# Patient Record
Sex: Male | Born: 1985 | Race: White | Hispanic: No | Marital: Married | Smoking: Never smoker
Health system: Southern US, Community
[De-identification: ages and names within clinical notes are randomized; demographics above are authoritative.]

## PROBLEM LIST (undated history)

## (undated) DIAGNOSIS — I10 Essential (primary) hypertension: Secondary | ICD-10-CM

## (undated) HISTORY — PX: HERNIA REPAIR: SHX51

## (undated) HISTORY — DX: Essential (primary) hypertension: I10

---

## 2011-11-20 ENCOUNTER — Ambulatory Visit (HOSPITAL_COMMUNITY): Admission: RE | Admit: 2011-11-20 | Payer: Self-pay | Source: Ambulatory Visit

## 2011-11-20 ENCOUNTER — Ambulatory Visit (HOSPITAL_COMMUNITY): Payer: Self-pay | Attending: Neurology

## 2011-11-27 ENCOUNTER — Ambulatory Visit (HOSPITAL_COMMUNITY): Admission: RE | Admit: 2011-11-27 | Payer: Self-pay | Source: Ambulatory Visit

## 2011-12-11 ENCOUNTER — Ambulatory Visit (HOSPITAL_COMMUNITY)
Admission: RE | Admit: 2011-12-11 | Discharge: 2011-12-11 | Disposition: A | Discharge status: Home / Self Care | Payer: Worker's Compensation | Source: Ambulatory Visit | Attending: Neurology | Admitting: Neurology

## 2011-12-11 DIAGNOSIS — R9431 Abnormal electrocardiogram [ECG] [EKG]: Secondary | ICD-10-CM | POA: Insufficient documentation

## 2014-01-26 ENCOUNTER — Ambulatory Visit: Payer: Self-pay | Admitting: Family

## 2014-03-02 ENCOUNTER — Ambulatory Visit: Payer: Self-pay | Admitting: Family

## 2014-06-08 ENCOUNTER — Encounter: Payer: Self-pay | Admitting: Family Medicine

## 2014-06-08 ENCOUNTER — Ambulatory Visit (INDEPENDENT_AMBULATORY_CARE_PROVIDER_SITE_OTHER): Payer: Managed Care, Other (non HMO) | Admitting: Family Medicine

## 2014-06-08 VITALS — BP 149/100 | HR 93 | Ht 71.0 in | Wt 309.0 lb

## 2014-06-08 DIAGNOSIS — G4733 Obstructive sleep apnea (adult) (pediatric): Secondary | ICD-10-CM | POA: Insufficient documentation

## 2014-06-08 DIAGNOSIS — E291 Testicular hypofunction: Secondary | ICD-10-CM | POA: Insufficient documentation

## 2014-06-08 DIAGNOSIS — K429 Umbilical hernia without obstruction or gangrene: Secondary | ICD-10-CM

## 2014-06-08 DIAGNOSIS — Z9989 Dependence on other enabling machines and devices: Secondary | ICD-10-CM

## 2014-06-08 DIAGNOSIS — R635 Abnormal weight gain: Secondary | ICD-10-CM

## 2014-06-08 NOTE — Progress Notes (Signed)
CC: Spencer HousekeeperMichael Delgado is a 29 y.o. male is here for Establish Care   Subjective: HPI:  Pleasant 29 year old here to establish care, husband of Spencer Delgado  Complains of difficulty with losing weight. He has tried multiple different diet plans most recently a 2000-calorie low carbohydrate predominantly protein diet that he stuck with for at least 8 weeks and did not lose any weight whatsoever. He stays active with his children, no formal exercise routine. Tries to watch what he eats. Has been gaining weight ever since a head injury many years ago all of this is been unintentional. He's been found to have a low testosterone level, early last year was in the 200s, he's been taking 200 mg twice a month and although he's been able to get it in the 500 range has not noticed any improvement with any degree of energy, libido or ability to lose weight. He's never had his thyroid checked  Complains of umbilical pain that has been present for at least the last 2 weeks. He believes that he has a hernia. It is worse to the touch or if he has any food that causes him to have flatulence. He denies melena or blood in his stool nor nausea. Pain is described only as pain and nonradiating. He had a history of an inguinal hernia that was repaired as a child with no manipulation of his umbilicus.  He has a history obstructive sleep apnea. He uses his CPAP nightly and it has made a great difference in prior issues with nonrestorative sleep.  Review of Systems - General ROS: negative for - chills, fever, night sweats, weight gain Ophthalmic ROS: negative for - decreased vision Psychological ROS: negative for - anxiety or depression ENT ROS: negative for - hearing change, nasal congestion, tinnitus or allergies Hematological and Lymphatic ROS: negative for - bleeding problems, bruising or swollen lymph nodes Breast ROS: negative Respiratory ROS: no cough, shortness of breath, or wheezing Cardiovascular ROS: no chest pain or  dyspnea on exertion Gastrointestinal ROS: no  change in bowel habits, or black or bloody stools Genito-Urinary ROS: negative for - genital discharge, genital ulcers, incontinence or abnormal bleeding from genitals Musculoskeletal ROS: negative for - joint pain or muscle pain Neurological ROS: negative for - headaches or memory loss Dermatological ROS: negative for lumps, mole changes, rash and skin lesion changes  Past Medical History  Diagnosis Date  . Hypertension     Past Surgical History  Procedure Laterality Date  . Hernia repair     Family History  Problem Relation Age of Onset  . Heart attack      grandfather   . Hyperlipidemia Mother     History   Social History  . Marital Status: Married    Spouse Name: N/A    Number of Children: N/A  . Years of Education: N/A   Occupational History  . Not on file.   Social History Main Topics  . Smoking status: Never Smoker   . Smokeless tobacco: Not on file  . Alcohol Use: 0.0 oz/week    0 Not specified per week  . Drug Use: No  . Sexual Activity:    Partners: Female   Other Topics Concern  . Not on file   Social History Narrative  . No narrative on file     Objective: BP 149/100 mmHg  Pulse 93  Ht 5\' 11"  (1.803 m)  Wt 309 lb (140.161 kg)  BMI 43.12 kg/m2  General: Alert and Oriented, No Acute  Distress HEENT: Pupils equal, round, reactive to light. Conjunctivae clear.  Moist mucous membranes Lungs: Clear to auscultation bilaterally, no wheezing/ronchi/rales.  Comfortable work of breathing. Good air movement. Cardiac: Regular rate and rhythm. Normal S1/S2.  No murmurs, rubs, nor gallops.   Abdomen: obese, soft no rebound or guarding. There is a approximately 1 and 1/2 cm diameter abdominal wall defect at the umbilicus with a hernia the size of a grape Extremities: No peripheral edema.  Strong peripheral pulses.  Mental Status: No depression, anxiety, nor agitation. Skin: Warm and dry.  Assessment &  Plan: Spencer Delgado was seen today for establish care.  Diagnoses and associated orders for this visit:  Hypogonadism in male  Umbilical hernia without obstruction and without gangrene - Ambulatory referral to General Surgery  OSA on CPAP  Abnormal weight gain - COMPLETE METABOLIC PANEL WITH GFR - TSH - T4    Abnormal weight gain: Checking metabolic panel TSH and free T4 to rule out primarily or secondary hypothyroidism Objective sleep apnea: Clinically controlled Umbilical hernia: We've referred him to general surgery to have this repaired, discussed avoiding strenuous activity to prevent progression of pain. Signs and symptoms requring emergent/urgent reevaluation were discussed with the patient. I've offered to him manage his testosterone and hypogonadism if he would prefer in the future.  Return if symptoms worsen or fail to improve.

## 2014-06-15 LAB — BASIC METABOLIC PANEL
Creatinine: 0.9 mg/dL (ref 0.6–1.3)
Glucose: 89 mg/dL
POTASSIUM: 4.4 mmol/L (ref 3.4–5.3)

## 2014-06-15 LAB — TSH: TSH: 1.73 u[IU]/mL (ref 0.41–5.90)

## 2014-06-15 LAB — CMP14+LP+1AC+CBC/D/PLT+TSH: CALCIUM: 10 mg/dL

## 2014-06-15 LAB — HEPATIC FUNCTION PANEL
ALT: 40 U/L (ref 10–40)
AST: 30 U/L (ref 14–40)
Alkaline Phosphatase: 69 U/L (ref 25–125)

## 2014-06-15 LAB — TSH+FREE T4: THYROXINE (T4): 7

## 2014-06-16 ENCOUNTER — Telehealth: Payer: Self-pay | Admitting: Family Medicine

## 2014-06-16 NOTE — Telephone Encounter (Signed)
Message left on vm 

## 2014-06-16 NOTE — Telephone Encounter (Signed)
Sue Lushndrea, Will you please let patient know that his thyroid function, liver function, blood sugar, and kidney function were all normal.  If he's interested in weight loss medication there are a variety of options that we can talk about when he follows up at his convenience.

## 2014-06-22 ENCOUNTER — Ambulatory Visit (INDEPENDENT_AMBULATORY_CARE_PROVIDER_SITE_OTHER): Payer: Managed Care, Other (non HMO) | Admitting: Family Medicine

## 2014-06-22 ENCOUNTER — Encounter: Payer: Self-pay | Admitting: Family Medicine

## 2014-06-22 VITALS — BP 153/95 | HR 98 | Wt 313.0 lb

## 2014-06-22 DIAGNOSIS — E291 Testicular hypofunction: Secondary | ICD-10-CM

## 2014-06-22 DIAGNOSIS — R635 Abnormal weight gain: Secondary | ICD-10-CM

## 2014-06-22 MED ORDER — TOPIRAMATE 50 MG PO TABS: 50.0000 mg | ORAL_TABLET | Freq: Two times a day (BID) | ORAL | Status: DC

## 2014-06-22 MED ORDER — AMBULATORY NON FORMULARY MEDICATION: Status: DC

## 2014-06-22 MED ORDER — TESTOSTERONE CYPIONATE 200 MG/ML IM SOLN: 300.0000 mg | INTRAMUSCULAR | Status: DC

## 2014-06-22 MED ORDER — TESTOSTERONE CYPIONATE 200 MG/ML IM SOLN: 200.0000 mg | INTRAMUSCULAR | Status: DC

## 2014-06-22 NOTE — Progress Notes (Signed)
CC: Dione HousekeeperMichael Delgado is a 29 y.o. male is here for wt check   Subjective: HPI:  Continued abnormal weight gain. He goes over his diet over the past few days it does not, he is eating more than 2000 cal a day, he tells me he's had similar meals since I saw him last and in about a week he's gained 4 pounds unintentionally. 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 Thyroid function was normal as saw him last. He has not noticed any benefit with weight loss or reduced weight gain since starting on testosterone little over year ago.  He tells me that since starting testosterone he has not noticed any change with libido, energy level or with weight gain. He's been taking 200 mg every 2 weeks. He wants know if I can take over his management of testosterone   Review Of Systems Outlined In HPI  Past Medical History  Diagnosis Date  . Hypertension     Past Surgical History  Procedure Laterality Date  . Hernia repair     Family History  Problem Relation Age of Onset  . Heart attack      grandfather   . Hyperlipidemia Mother     History   Social History  . Marital Status: Married    Spouse Name: N/A  . Number of Children: N/A  . Years of Education: N/A   Occupational History  . Not on file.   Social History Main Topics  . Smoking status: Never Smoker   . Smokeless tobacco: Not on file  . Alcohol Use: 0.0 oz/week    0 Standard drinks or equivalent per week  . Drug Use: No  . Sexual Activity:    Partners: Female   Other Topics Concern  . Not on file   Social History Narrative     Objective: BP 153/95 mmHg  Pulse 98  Wt 313 lb (141.976 kg)  Vital signs reviewed. General: Alert and Oriented, No Acute Distress HEENT: Pupils equal, round, reactive to light. Conjunctivae clear.  External ears unremarkable.  Moist mucous membranes. Lungs: Clear and comfortable work of breathing, speaking in full sentences without accessory  muscle use. Cardiac: Regular rate and rhythm.  Neuro: CN II-XII grossly intact, gait normal. Extremities: No peripheral edema.  Strong peripheral pulses.  Mental Status: No depression, anxiety, nor agitation. Logical though process. Skin: Warm and dry.  Assessment & Plan: Spencer Delgado was seen today for wt check.  Diagnoses and all orders for this visit:  Abnormal weight gain Orders: -     Discontinue: topiramate (TOPAMAX) 50 MG tablet; Take 1 tablet (50 mg total) by mouth 2 (two) times daily. To help reduce appetite. -     Ambulatory referral to Endocrinology -     topiramate (TOPAMAX) 50 MG tablet; Take 1 tablet (50 mg total) by mouth 2 (two) times daily. To help reduce appetite.  Hypogonadism male Orders: -     Ambulatory referral to Endocrinology -     testosterone cypionate (DEPOTESTOTERONE CYPIONATE) 200 MG/ML injection; Inject 1.5 mLs (300 mg total) into the muscle every 14 (fourteen) days.  Other orders -     Discontinue: testosterone cypionate (DEPOTESTOTERONE CYPIONATE) 200 MG/ML injection; Inject 1 mL (200 mg total) into the muscle every 14 (fourteen) days. -     AMBULATORY NON FORMULARY MEDICATION; 2mL syringes and 23G 1-1/2 inch needles.  Dx: Testosterone Deficiency   Abnormal weight gain: Start Topamax, joint agreement that this is unlikely  to have much of a powerful effect but is worth trying since it does not seem that his portion control or caloric intake is significantly contributing to his weight gain. Referral to endocrinology given history of brain injury to see if there is a pituitary dysfunction contributing to his weight gain Hypogonadism: Uncontrolled chronic condition increasing testosterone to 300 mg every 2 weeks  25 minutes spent face-to-face during visit today of which at least 50% was counseling or coordinating care regarding: 1. Abnormal weight gain   2. Hypogonadism male      Return in about 4 weeks (around 07/20/2014).

## 2014-06-29 ENCOUNTER — Telehealth: Payer: Self-pay | Admitting: *Deleted

## 2014-06-29 NOTE — Telephone Encounter (Signed)
Pt is calling to check on the status of his referral. He states he has not heard anything but when looking back in the chart someone did call him but pt told him he was seeing someone for abnormal wt gain and testosterone. The referral was placed for abnormal wt gain and testosterone since he had not been losing wt so the referral was correct.(see office notes) Can you please call and get this patient scheduled?

## 2014-06-29 NOTE — Telephone Encounter (Signed)
Mr. Spencer Delgado has been scheduled for 3/3 @ 9:00/arrival time 8:45. He is aware of his appt.

## 2014-07-14 ENCOUNTER — Encounter: Payer: Self-pay | Admitting: Endocrinology

## 2014-07-14 ENCOUNTER — Ambulatory Visit (INDEPENDENT_AMBULATORY_CARE_PROVIDER_SITE_OTHER): Payer: BLUE CROSS/BLUE SHIELD | Admitting: Endocrinology

## 2014-07-14 VITALS — BP 168/105 | HR 96 | Temp 97.7°F | Resp 16 | Ht 71.0 in | Wt 313.0 lb

## 2014-07-14 DIAGNOSIS — R7989 Other specified abnormal findings of blood chemistry: Secondary | ICD-10-CM

## 2014-07-14 DIAGNOSIS — R635 Abnormal weight gain: Secondary | ICD-10-CM

## 2014-07-14 DIAGNOSIS — E291 Testicular hypofunction: Secondary | ICD-10-CM

## 2014-07-14 DIAGNOSIS — R5383 Other fatigue: Secondary | ICD-10-CM

## 2014-07-14 LAB — T4, FREE: FREE T4: 0.8 ng/dL (ref 0.60–1.60)

## 2014-07-14 LAB — GLUCOSE, RANDOM: Glucose, Bld: 99 mg/dL (ref 70–99)

## 2014-07-14 LAB — TESTOSTERONE: Testosterone: 344.03 ng/dL (ref 300.00–890.00)

## 2014-07-14 NOTE — Progress Notes (Addendum)
Patient ID: Dione HousekeeperMichael Braddock, male   DOB: February 14, 1986, 29 y.o.   MRN: [id number]          Chief complaint:  Fatigue  History of Present Illness  He has had complaints offatigue, decreased motivation, decreased libido, lack of energy since 2012 The symptoms started sometime after his head injury in late 2011 He thinks he was previously feeling fairly good but subsequently has had continued sluggishness and also weight gain.    There is no history of the following: Hot flushes, sweats, breast enlargement, long term steroid use, history of testicular injury or mumps in childhood. No history of low impact fracture He has not had any problems with fertility, his last and third child was 19 months ago  He was seen by urologist a year ago and because of his decreased libido and he was tested for hypogonadism  Prior lab results show baselinetestosterone level of 205, not clear what time of the day this was drawn.  No free testosterone level was done Prolactin level: Not done LH level: None    Testoserone supplements that hehas used include Depo-Testosterone injections, 200 mg every 2 weeks starting in 08/2013 He was trained how to do the injections himself and has been doing them regularly.  He does not think he has any improvement in his energy level with the testosterone and he continues to gain weight. He may have increased libido for 2-3 days after his testosterone injection but very little after the first week  He has not had any follow-up levels of his testosterone He was suggested by his PCP that he increase the dose to 300 mg but he has not done this as yet.  He is due for his 2 week injection today     Medication List       This list is accurate as of: 07/14/14  9:37 AM.  Always use your most recent med list.               AMBULATORY NON FORMULARY MEDICATION  2mL syringes and 23G 1-1/2 inch needles.  Dx: Testosterone Deficiency     testosterone cypionate 200  MG/ML injection  Commonly known as:  DEPOTESTOTERONE CYPIONATE  Inject 1.5 mLs (300 mg total) into the muscle every 14 (fourteen) days.     topiramate 50 MG tablet  Commonly known as:  TOPAMAX  Take 1 tablet (50 mg total) by mouth 2 (two) times daily. To help reduce appetite.        Allergies:  Allergies  Allergen Reactions  . Morphine And Related Other (See Comments)    Stops heart    Past Medical History  Diagnosis Date  . Hypertension     Past Surgical History  Procedure Laterality Date  . Hernia repair      Family History  Problem Relation Age of Onset  . Heart attack      grandfather   . Hyperlipidemia Mother     Social History:  reports that he has never smoked. He does not have any smokeless tobacco history on file. He reports that he drinks alcohol. He reports that he does not use illicit drugs.  ROS   Constitutional: he has had continual weight gain since 2012  He started gaining weight following his brain injury in 2011 when he was 220 pounds He thinks he has gained weight consistently when last year when he has probably gained about 40 pounds He thinks he is trying to eat healthy and not nodular portions or  fast food Does try to exercise a little   Wt Readings from Last 3 Encounters:  07/14/14 313 lb (141.976 kg)  06/22/14 313 lb (141.976 kg)  06/08/14 309 lb (140.161 kg)   Eyes: no history of blurred vision, has normal peripheral vision   ENT: no hoarseness   Cardiovascular:  No leg swelling.  He has a history of high blood pressure as all his blood pressure readings recently appear to be high, has not been suggested treatment  Respiratory: shortness of breath not present.  He is being treated for sleep apnea   Gastrointestinal: no constipation or abdominal pain  Musculoskeletal: no joint aches  Neurological: no headaches, numbness or tingling in feet or hands  Psychiatric: no symptoms of anxiety or depressed mood.  Has lack of  motivation  Endocrine: No heat or cold intolerance  No history of abnormal fasting glucose or diabetes, no recent labs available however  No history of unusual headaches   General Examination:   BP 168/105 mmHg  Pulse 96  Temp(Src) 97.7 F (36.5 C)  Resp 16  Ht  (1.803 m)  Wt 313 lb (141.976 kg)  BMI 43.67 kg/m2  SpO2 95%  GENERAL APPEARANCE generalized obesity present, no cushingoid features.  SKIN:normal, no rash or pigmentation.  HEENT:Oral mucosa normal. Normal oropharyngeal opening.  EYES:normal external appearance of eyes, Fundii benign.   NECK:no lymphadenopathy, no thyromegaly.  CHEST: Gynecomastia absent on the right and minimal on the left LUNGS:clear to auscultation bilaterally, no wheezes, rhonchi, rales.   HEART:normal S1 And S2, no S3, S4, murmur or click.  ABDOMEN:no hepatosplenomegaly, no masses palpated, soft and not tender.   MALE GENITOURINARY:His testicles are somewhat retractile but appear to be relatively small about 3 cm in size   MUSCULOSKELETALNo enlargement or deformity of joints.  EXTREMITIES:no clubbing, no edema.  NEUROLOGIC EXAM: Biceps and ankle reflexes normal (2+) bilaterally.   Assessment/ Plan:  FATIGUE which has been persistent for at least 3-4 years of unclear etiology. Although he had a relatively low normal total testosterone about a year ago not clear if this was done in a fasting state and this was not confirmed with a free testosterone level which would be necessary because of his morbid obesity. Also no evaluation for the etiology of his low testosterone was done especially with pituitary function evaluation of her prolactin levels  Further his symptoms of fatigue and sluggishness have not improved with testosterone supplementation; no follow-up testosterone levels available also Although he was concerned about the weight gain being the result of  hypogonadism have explained to him that low testosterone levels do not cause obesity.  Today will have his testosterone level checked along with pituitary hormone functions except LH Have recommended reevaluation of his testosterone level after stopping his injection for at least 6 weeks and will need fasting free testosterone level done to determine if he truly has hypogonadism  OBESITY: This appears to have started after his head injury and not clear this is due to hypothalamic cause Currently not as active as he used to be because of his fatigue and lack of motivation Not clear if he has any associated depression Currently not benefiting from Topamax which has been prescribed by PCP but discussed that he probably will need weight loss medications for controlling the obesity  Follow-up after labs complete   [facility] 07/14/2014, 9:37 AM

## 2014-07-15 LAB — PROLACTIN: Prolactin: 11.1 ng/mL (ref 4.0–15.2)

## 2014-07-15 LAB — INSULIN-LIKE GROWTH FACTOR: INSULIN LIKE GF 1: 152 ng/mL (ref 98–282)

## 2014-07-17 NOTE — Progress Notes (Signed)
Quick Note:  Please let patient know that tests for pituitary are normal and testosterone level is normal range also Will discuss further on follow-up ______

## 2014-07-19 ENCOUNTER — Ambulatory Visit (INDEPENDENT_AMBULATORY_CARE_PROVIDER_SITE_OTHER): Payer: BLUE CROSS/BLUE SHIELD | Admitting: Family Medicine

## 2014-07-19 ENCOUNTER — Encounter: Payer: Self-pay | Admitting: Family Medicine

## 2014-07-19 VITALS — BP 147/97 | HR 79 | Wt 314.0 lb

## 2014-07-19 DIAGNOSIS — E669 Obesity, unspecified: Secondary | ICD-10-CM

## 2014-07-19 DIAGNOSIS — I10 Essential (primary) hypertension: Secondary | ICD-10-CM

## 2014-07-19 DIAGNOSIS — G4733 Obstructive sleep apnea (adult) (pediatric): Secondary | ICD-10-CM

## 2014-07-19 DIAGNOSIS — Z9989 Dependence on other enabling machines and devices: Secondary | ICD-10-CM

## 2014-07-19 MED ORDER — PHENTERMINE HCL 30 MG PO TBDP: 30.0000 mg | ORAL_TABLET | Freq: Every day | ORAL | Status: DC

## 2014-07-19 MED ORDER — LISINOPRIL 20 MG PO TABS: ORAL_TABLET | ORAL | Status: DC

## 2014-07-19 NOTE — Progress Notes (Signed)
CC: Spencer HousekeeperMichael Delgado is a 29 y.o. male is here for f/u wt loss   Subjective: HPI:  Follow-up weight gain: Labs with his endocrinologist were normal. Low suspicion of pituitary abnormality contributing to his weight gain. He is still adamant that he eats less than 2000 cal a day. Stays active with his children but no formal exercise routine. No weight gain since I saw him last. He wants to know if he could try phentermine since Topamax was helpful for 2 days then ineffective. He's never been on phentermine before.  Follow essential hypertension: Reports that he's never had issues with blood pressure until he was over 200 pounds. He's never taken blood pressure medications in the past. No chest pain shortness of breath orthopnea nor peripheral edema.  Review Of Systems Outlined In HPI  Past Medical History  Diagnosis Date  . Hypertension     Past Surgical History  Procedure Laterality Date  . Hernia repair     Family History  Problem Relation Age of Onset  . Heart attack      grandfather   . Hyperlipidemia Mother     History   Social History  . Marital Status: Married    Spouse Name: N/A  . Number of Children: N/A  . Years of Education: N/A   Occupational History  . Not on file.   Social History Main Topics  . Smoking status: Never Smoker   . Smokeless tobacco: Not on file  . Alcohol Use: 0.0 oz/week    0 Standard drinks or equivalent per week  . Drug Use: No  . Sexual Activity:    Partners: Female   Other Topics Concern  . Not on file   Social History Narrative     Objective: BP 147/97 mmHg  Pulse 79  Wt 314 lb (142.429 kg)  Vital signs reviewed. General: Alert and Oriented, No Acute Distress HEENT: Pupils equal, round, reactive to light. Conjunctivae clear.  External ears unremarkable.  Moist mucous membranes. Lungs: Clear and comfortable work of breathing, speaking in full sentences without accessory muscle use. Cardiac: Regular rate and rhythm.   Abdomen: Obese soft Neuro: CN II-XII grossly intact, gait normal. Extremities: No peripheral edema.  Strong peripheral pulses.  Mental Status: No depression, anxiety, nor agitation. Logical though process. Skin: Warm and dry. Assessment & Plan: Casimiro NeedleMichael was seen today for f/u wt loss.  Diagnoses and all orders for this visit:  Essential hypertension Orders: -     lisinopril (PRINIVIL,ZESTRIL) 20 MG tablet; One tablet by mouth daily for blood pressure control.  Obesity Orders: -     Phentermine HCl 30 MG TBDP; Take 30 mg by mouth daily.  OSA on CPAP   Essential hypertension: Uncontrolled chronic condition, needs to start lisinopril immediately. Hopefully this will improve with weight loss Obesity: Uncontrolled, discussed we can try phentermine however this has the potential to worsen his blood pressure he needs to assure me that he'll be taking lisinopril and will follow-up in a month before he runs out of the medication to see if it is influencing his blood pressure at all. If this does not improve I prepared him that I would be sending him to bariatric's as the next step. Follow-up sleep apnea: When asked my office to get in touch with his respiratory supply company to see if we can get data from his sleep apnea machine regarding compliance, he also tells me that he needs new tubing and mask, he thinks is starting to crack.  25  minutes spent face-to-face during visit today of which at least 50% was counseling or coordinating care regarding: 1. Essential hypertension   2. Obesity   3. OSA on CPAP      Return in about 4 weeks (around 08/16/2014) for Weight.

## 2014-07-20 ENCOUNTER — Telehealth: Payer: Self-pay | Admitting: Family Medicine

## 2014-07-20 NOTE — Telephone Encounter (Signed)
Sue LushAndrea, Can you please contact [facility] and see if they have Spencer Delgado as a patient on their records.  He's requesting new tubing and a facemask since he believes it's starting to crack.

## 2014-07-21 NOTE — Telephone Encounter (Signed)
Called [facility] and they do have patient on file. They will ship tubing and face mask. He also qualifies for new filters and water chamber so they are shipping this to him as well and this is at no out of pocket cost to him. Left message on vm

## 2014-07-26 ENCOUNTER — Encounter: Payer: Self-pay | Admitting: Family Medicine

## 2014-08-09 ENCOUNTER — Encounter: Payer: Self-pay | Admitting: Family Medicine

## 2014-08-16 ENCOUNTER — Ambulatory Visit: Payer: Self-pay | Admitting: Family Medicine

## 2014-08-17 ENCOUNTER — Ambulatory Visit: Payer: Self-pay | Admitting: Family Medicine

## 2014-08-22 ENCOUNTER — Ambulatory Visit (INDEPENDENT_AMBULATORY_CARE_PROVIDER_SITE_OTHER): Payer: BLUE CROSS/BLUE SHIELD | Admitting: Family Medicine

## 2014-08-22 ENCOUNTER — Encounter: Payer: Self-pay | Admitting: Family Medicine

## 2014-08-22 VITALS — BP 147/95 | HR 79 | Wt 306.0 lb

## 2014-08-22 DIAGNOSIS — I10 Essential (primary) hypertension: Secondary | ICD-10-CM

## 2014-08-22 DIAGNOSIS — E669 Obesity, unspecified: Secondary | ICD-10-CM | POA: Diagnosis not present

## 2014-08-22 MED ORDER — LISINOPRIL-HYDROCHLOROTHIAZIDE 20-25 MG PO TABS: 1.0000 | ORAL_TABLET | Freq: Every day | ORAL | Status: DC

## 2014-08-22 MED ORDER — PHENTERMINE HCL 37.5 MG PO CAPS: 37.5000 mg | ORAL_CAPSULE | ORAL | Status: DC

## 2014-08-22 NOTE — Progress Notes (Signed)
CC: Spencer Delgado is a 29 y.o. male is here for f/u wt and BP   Subjective: HPI:  Follow-up obesity: Since I saw him last he has been taking phentermine on a daily basis. He tells me it has helped with portion control but he still believes there is room for improvement. He denies any known side effects. Denies anxiety, irritability, sleep disturbance or any mental disturbance.  Follow-up hypertension: Continues to take lisinopril 20 mg daily. He tells me that he forgot to mention in the past that he believes that he retains fluid. For example at the end of the day he will have it pitting wherever his socks were covering on his legs and if he is ever on his knees washing his children for more than a few minutes when he gets up there is a pit where his knees were in contact with the floor. This has been present for matter of months and did not change after starting lisinopril. He believes that the fluid retention is primarily in the lower extremities and symmetric. Its worse at the end of the day and improved after sleeping. No outside blood pressures report. No chest pain shortness of breath orthopnea   Review Of Systems Outlined In HPI  Past Medical History  Diagnosis Date  . Hypertension     Past Surgical History  Procedure Laterality Date  . Hernia repair     Family History  Problem Relation Age of Onset  . Heart attack      grandfather   . Hyperlipidemia Mother     History   Social History  . Marital Status: Married    Spouse Name: N/A  . Number of Children: N/A  . Years of Education: N/A   Occupational History  . Not on file.   Social History Main Topics  . Smoking status: Never Smoker   . Smokeless tobacco: Not on file  . Alcohol Use: 0.0 oz/week    0 Standard drinks or equivalent per week  . Drug Use: No  . Sexual Activity:    Partners: Female   Other Topics Concern  . Not on file   Social History Narrative     Objective: BP 147/95 mmHg  Pulse 79  Wt  306 lb (138.801 kg)  General: Alert and Oriented, No Acute Distress HEENT: Pupils equal, round, reactive to light. Conjunctivae clear.  Moist mucous membranes Lungs: Clear to auscultation bilaterally, no wheezing/ronchi/rales.  Comfortable work of breathing. Good air movement. Cardiac: Regular rate and rhythm. Normal S1/S2.  No murmurs, rubs, nor gallops.   Abdomen: obese and soft Extremities: trace pitting edema around the ankles bilaterally and symmetric.  Strong peripheral pulses.  Mental Status: No depression, anxiety, nor agitation. Skin: Warm and dry.  Assessment & Plan: Spencer Delgado was seen today for f/u wt and bp.  Diagnoses and all orders for this visit:  Essential hypertension Orders: -     lisinopril-hydrochlorothiazide (PRINZIDE,ZESTORETIC) 20-25 MG per tablet; Take 1 tablet by mouth daily.  Obesity Orders: -     phentermine 37.5 MG capsule; Take 1 capsule (37.5 mg total) by mouth every morning.   Essential hypertension: Uncontrolled chronic condition continue lisinopril but adding hydrochlorothiazide Obesity: Improving, increasing phentermine return in one month  Return in about 4 weeks (around 09/19/2014).

## 2014-09-09 ENCOUNTER — Other Ambulatory Visit: Payer: Self-pay

## 2014-09-13 ENCOUNTER — Telehealth: Payer: Self-pay | Admitting: Endocrinology

## 2014-09-13 ENCOUNTER — Encounter: Payer: Self-pay | Admitting: *Deleted

## 2014-09-13 ENCOUNTER — Ambulatory Visit: Payer: Self-pay | Admitting: Endocrinology

## 2014-09-13 DIAGNOSIS — Z0289 Encounter for other administrative examinations: Secondary | ICD-10-CM

## 2014-09-13 NOTE — Telephone Encounter (Signed)
Patient no showed today's appt. Please advise on how to follow up. °A. No follow up necessary. °B. Follow up urgent. Contact patient immediately. °C. Follow up necessary. Contact patient and schedule visit in ___ days. °D. Follow up advised. Contact patient and schedule visit in ____weeks. ° °

## 2014-09-14 NOTE — Telephone Encounter (Signed)
Letter mailed

## 2014-09-21 ENCOUNTER — Ambulatory Visit: Payer: Self-pay | Admitting: Family Medicine

## 2014-09-21 NOTE — Telephone Encounter (Signed)
LM for pt to call and schedule this appt.

## 2014-12-26 ENCOUNTER — Encounter: Payer: Self-pay | Admitting: Family Medicine

## 2014-12-26 ENCOUNTER — Ambulatory Visit (INDEPENDENT_AMBULATORY_CARE_PROVIDER_SITE_OTHER): Payer: BLUE CROSS/BLUE SHIELD | Admitting: Family Medicine

## 2014-12-26 VITALS — BP 135/86 | HR 94 | Ht 71.0 in | Wt 298.0 lb

## 2014-12-26 DIAGNOSIS — E669 Obesity, unspecified: Secondary | ICD-10-CM | POA: Diagnosis not present

## 2014-12-26 DIAGNOSIS — I10 Essential (primary) hypertension: Secondary | ICD-10-CM

## 2014-12-26 DIAGNOSIS — E291 Testicular hypofunction: Secondary | ICD-10-CM | POA: Diagnosis not present

## 2014-12-26 DIAGNOSIS — L0390 Cellulitis, unspecified: Secondary | ICD-10-CM | POA: Diagnosis not present

## 2014-12-26 MED ORDER — LIRAGLUTIDE -WEIGHT MANAGEMENT 18 MG/3ML ~~LOC~~ SOPN: 0.6000 mg | PEN_INJECTOR | Freq: Every day | SUBCUTANEOUS | Status: DC

## 2014-12-26 MED ORDER — INSULIN PEN NEEDLE 31G X 5 MM MISC: Status: DC

## 2014-12-26 MED ORDER — LISINOPRIL-HYDROCHLOROTHIAZIDE 20-25 MG PO TABS: 1.0000 | ORAL_TABLET | Freq: Every day | ORAL | Status: DC

## 2014-12-26 MED ORDER — SULFAMETHOXAZOLE-TRIMETHOPRIM 800-160 MG PO TABS: 1.0000 | ORAL_TABLET | Freq: Two times a day (BID) | ORAL | Status: DC

## 2014-12-26 MED ORDER — TESTOSTERONE CYPIONATE 200 MG/ML IM SOLN: 300.0000 mg | INTRAMUSCULAR | Status: DC

## 2014-12-26 NOTE — Progress Notes (Signed)
CC: Spencer Delgado is a 29 y.o. male is here for Obesity   Subjective: HPI:  Follow-up hypogonadism: He ran out of insurance in the late spring and had to stop taking testosterone. Since running out of his medications noticed that he's not as mentally sharp as he was when he was on this medication. He denies any other symptoms since stopping it. He would like to know if he can have a refill.  Follow-up obesity: He did not lose any weight on phentermine. He started a 1700 calorie a day diet and noticed no weight loss over  2 weeks and decided to stop. He continues to gain weight unintentionally. No formal physical activity regimen however he does move boxes throughout his 9-5 weekday shifts. He was never something other than phentermine that can help with weight loss. He has wife have thought a little bit about  Bariatric surgery however they are worried about complications.  Follow-up essential hypertension: He ran out of lisinopril-hydrochlorothiazide since I saw him last. He reports that blood pressures have been fluctuating from stage I to stage II hypertension outside of our office. He denies chest pain shortness of breath orthopnea nor peripheral edema  On the 11th of this month he had an umbilical hernia repaired. Since the day of surgery he's had an increase in a white thick discharge coming from the umbilicus which is also the surgical site. He tells me he brought this to the attention of his surgeon however was reassured that this is normal. He tells me it's painful. Denies fevers, chills nor flushing   Review Of Systems Outlined In HPI  Past Medical History  Diagnosis Date  . Hypertension     Past Surgical History  Procedure Laterality Date  . Hernia repair     Family History  Problem Relation Age of Onset  . Heart attack      grandfather   . Hyperlipidemia Mother     Social History   Social History  . Marital Status: Married    Spouse Name: N/A  . Number of Children:  N/A  . Years of Education: N/A   Occupational History  . Not on file.   Social History Main Topics  . Smoking status: Never Smoker   . Smokeless tobacco: Not on file  . Alcohol Use: 0.0 oz/week    0 Standard drinks or equivalent per week  . Drug Use: No  . Sexual Activity:    Partners: Female   Other Topics Concern  . Not on file   Social History Narrative     Objective: BP 135/86 mmHg  Pulse 94  Ht  (1.803 m)  Wt 298 lb (135.172 kg)  BMI 41.58 kg/m2  Vital signs reviewed. General: Alert and Oriented, No Acute Distress HEENT: Pupils equal, round, reactive to light. Conjunctivae clear.  External ears unremarkable.  Moist mucous membranes. Lungs: Clear and comfortable work of breathing, speaking in full sentences without accessory muscle use. Cardiac: Regular rate and rhythm.  Neuro: CN II-XII grossly intact, gait normal. Extremities: No peripheral edema.  Strong peripheral pulses.  Mental Status: No depression, anxiety, nor agitation. Logical though process. Skin: Warm and dry.understandably tender umbilicus however moderate purulent discharge coming from the center of the umbilicus Assessment & Plan: Aymen was seen today for obesity.  Diagnoses and all orders for this visit:  Hypogonadism male -     testosterone cypionate (DEPOTESTOSTERONE CYPIONATE) 200 MG/ML injection; Inject 1.5 mLs (300 mg total) into the muscle every 14 (fourteen)  days.  Obesity -     Discontinue: Liraglutide -Weight Management (SAXENDA) 18 MG/3ML SOPN; Inject 0.6 mg into the skin daily. -     Liraglutide -Weight Management (SAXENDA) 18 MG/3ML SOPN; Inject 0.6 mg into the skin daily. Increase by 0.6mg  every week as tolerated (nausea).  Maximum dose of 3mg  daily.  Essential hypertension -     lisinopril-hydrochlorothiazide (PRINZIDE,ZESTORETIC) 20-25 MG per tablet; Take 1 tablet by mouth daily.  Cellulitis, unspecified cellulitis site, unspecified extremity site, unspecified  laterality -     sulfamethoxazole-trimethoprim (BACTRIM DS,SEPTRA DS) 800-160 MG per tablet; Take 1 tablet by mouth 2 (two) times daily.  Other orders -     Insulin Pen Needle (B-D UF III MINI PEN NEEDLES) 31G X 5 MM MISC; Use new needle daily to inject Saxenda.  hypogonadism: Restart former regimen of testosterone will need a recheck in 2-3 months. Obesity: Discussed risks and benefits of Saxenda an episode of my recommendation for the next step in trying to lose weight. Start considering bariatric surgery as an option if this does not work.Time was taken to discuss how to titrate this medication and administer the medication. Essential hypertension: Controlled restart lisinopril-hydrochlorothiazide Cellulitis at his surgical site: Continue Augmentin that has been prescribed by his surgeon, broadening bacterial coverage with Bactrim.  Return in about 4 weeks (around 01/23/2015) for Weight and BP.  40 minutes spent face-to-face during visit today of which at least 50% was counseling or coordinating care regarding: 1. Hypogonadism male   2. Obesity   3. Essential hypertension   4. Cellulitis, unspecified cellulitis site, unspecified extremity site, unspecified laterality

## 2014-12-28 ENCOUNTER — Telehealth: Payer: Self-pay | Admitting: Family Medicine

## 2014-12-28 NOTE — Telephone Encounter (Signed)
Pt called clinic to check status on PA for his Testosterone and Saxenda.  Pt notified we will begin the PA process.

## 2014-12-28 NOTE — Telephone Encounter (Signed)
Received fax for prior authorization on Saxenda sent through cover my meds waiting on authorization. - CF °

## 2014-12-30 ENCOUNTER — Telehealth: Payer: Self-pay | Admitting: Family Medicine

## 2014-12-30 NOTE — Telephone Encounter (Signed)
Received fax for prior authorization on Testosterone Depo sent through cover my meds and faxed levels to [facility] waiting on authorization. - CF

## 2014-12-30 NOTE — Telephone Encounter (Signed)
Received fax from [facility] and they have approved Saxenda from 12/28/2014 - 05/02/2015 reference #[id number]

## 2015-01-03 ENCOUNTER — Telehealth: Payer: Self-pay | Admitting: Family Medicine

## 2015-01-03 NOTE — Telephone Encounter (Signed)
Cindy or [place], I'm looking over the denied PA for depo-testosterone and I think there is an error on the last page. The patient DID have symptoms of androgen deficiency. His baseline testosterone was 205 from 07/2013 in the [place] system.  All values in the [facility] system are normal because they were obtained while he was receiving testosterone supplementation.   I think his PA has been incorrectly denied because of saying "No" to the symptom question and sending them an incorrect testosterone level.  Can you please see if this can be corrected? PA response in the clear inbox on the wall.

## 2015-01-03 NOTE — Telephone Encounter (Signed)
Request was resubmitted with correct information.

## 2015-01-03 NOTE — Telephone Encounter (Signed)
Received fax from [facility] and they denied coverage on Testosterone due to the patient's levels were in normal limits. Reference # [id number]. - CF

## 2015-01-06 NOTE — Telephone Encounter (Signed)
Sue Lush, Will you please let patient know that [employer] has denied coverage for testosterone because they state his levels were not in the deficient range.  I tried to explain to them that his most recent level was normal because he was getting testosterone injections and it was clearly low in March of 2015.  Whoever was handling his case doesn't seem to understand this.  I would recommend he have his testosterone level rechecked as long has he has not had a shot in over two months and then if in the deficient range I will resubmit his the clam for this Rx.

## 2015-01-09 NOTE — Telephone Encounter (Signed)
Pt states that he has already picked up the testosterone and his insurance approved it. For some reason our office and the pharm keep running his insurance as [employer] [place] and he has [place]

## 2015-01-24 ENCOUNTER — Telehealth: Payer: Self-pay | Admitting: *Deleted

## 2015-01-24 DIAGNOSIS — E669 Obesity, unspecified: Secondary | ICD-10-CM

## 2015-01-24 NOTE — Telephone Encounter (Signed)
Wife called and thought they were supposed to have a referral to a dietician. Referral placed

## 2015-02-14 ENCOUNTER — Ambulatory Visit: Payer: Self-pay | Admitting: Dietician

## 2015-02-22 ENCOUNTER — Encounter: Payer: Self-pay | Admitting: Family Medicine

## 2015-02-22 ENCOUNTER — Ambulatory Visit (INDEPENDENT_AMBULATORY_CARE_PROVIDER_SITE_OTHER): Payer: BLUE CROSS/BLUE SHIELD | Admitting: Family Medicine

## 2015-02-22 VITALS — BP 147/114 | HR 86 | Temp 98.4°F | Wt 317.0 lb

## 2015-02-22 DIAGNOSIS — L089 Local infection of the skin and subcutaneous tissue, unspecified: Secondary | ICD-10-CM | POA: Diagnosis not present

## 2015-02-22 DIAGNOSIS — I10 Essential (primary) hypertension: Secondary | ICD-10-CM | POA: Diagnosis not present

## 2015-02-22 DIAGNOSIS — S31109A Unspecified open wound of abdominal wall, unspecified quadrant without penetration into peritoneal cavity, initial encounter: Secondary | ICD-10-CM

## 2015-02-22 DIAGNOSIS — R635 Abnormal weight gain: Secondary | ICD-10-CM | POA: Diagnosis not present

## 2015-02-22 NOTE — Patient Instructions (Signed)
Thank you for coming in today. Stop testosterone temporarily. Get labs today. Follow-up with Dr. Ivan AnchorsHommel next week.  Recommend a second opinion Consider [facility] in [place]  Call or go to the emergency room if you get worse, have trouble breathing, have chest pains, or palpitations.

## 2015-02-22 NOTE — Assessment & Plan Note (Signed)
Likely adipose gain. Check labs to assure no fluid gain with TSH CMP CBC and proBNP.

## 2015-02-22 NOTE — Progress Notes (Signed)
Spencer Delgado is a 29 y.o. male who [facility]  today for abdominal wound. Patient had umbilical hernia repair in August. He had immediate postoperative complications with a possibly infected wound. He is here today for a second opinion before he gets a exploratory surgery to potentially remove infected mesh. He notes the wound is persistent in his umbilicus and continues to drain. He notes mild abdominal pain. He denies any fevers chills nausea vomiting or diarrhea.  Additionally he notes fevers persistent weight gain in the postoperative area. This is despite an attempt with Saxenda.   Past Medical History  Diagnosis Date  . Hypertension    Past Surgical History  Procedure Laterality Date  . Hernia repair     Social History  Substance Use Topics  . Smoking status: Never Smoker   . Smokeless tobacco: Not on file  . Alcohol Use: 0.0 oz/week    0 Standard drinks or equivalent per week   family history includes Heart attack in an other family member; Hyperlipidemia in his mother.  ROS as above Medications: Current Outpatient Prescriptions  Medication Sig Dispense Refill  . AMBULATORY NON FORMULARY MEDICATION 2mL syringes and 23G 1-1/2 inch needles.  Dx: Testosterone Deficiency 20 Units 11  . Insulin Pen Needle (B-D UF III MINI PEN NEEDLES) 31G X 5 MM MISC Use new needle daily to inject Saxenda. 50 each 11  . lisinopril-hydrochlorothiazide (PRINZIDE,ZESTORETIC) 20-25 MG per tablet Take 1 tablet by mouth daily. 30 tablet 3  . testosterone cypionate (DEPOTESTOSTERONE CYPIONATE) 200 MG/ML injection Inject 1.5 mLs (300 mg total) into the muscle every 14 (fourteen) days. 10 mL 2   No current facility-administered medications for this visit.   Allergies  Allergen Reactions  . Morphine And Related Other (See Comments)    Stops heart     Exam:  BP 147/114 mmHg  Pulse 86  Temp(Src) 98.4 F (36.9 C) (Oral)  Wt 317 lb (143.79  kg) Gen: Well NAD obese HEENT: EOMI,  MMM Lungs: Normal work of breathing. CTABL Heart: RRR no MRG Abd: NABS, Soft. Nondistended, Nontender Exts: Brisk capillary refill, warm and well perfused. No leg edema Skin: Persisting granulated wound at the umbilicus. Drainage present on packing material. Not particularly tender.  No results found for this or any previous visit (from the past 24 hour(s)). No results found.   Please see individual assessment and plan sections.

## 2015-02-22 NOTE — Assessment & Plan Note (Signed)
Elevated today. Recheck with PCP in one week.

## 2015-02-22 NOTE — Assessment & Plan Note (Signed)
Recommend second opinion.

## 2015-02-23 LAB — CBC
HCT: 46.2 % (ref 39.0–52.0)
HEMOGLOBIN: 16 g/dL (ref 13.0–17.0)
MCH: 29.6 pg (ref 26.0–34.0)
MCHC: 34.6 g/dL (ref 30.0–36.0)
MCV: 85.4 fL (ref 78.0–100.0)
MPV: 9.5 fL (ref 8.6–12.4)
Platelets: 320 10*3/uL (ref 150–400)
RBC: 5.41 MIL/uL (ref 4.22–5.81)
RDW: 14.9 % (ref 11.5–15.5)
WBC: 8 10*3/uL (ref 4.0–10.5)

## 2015-02-23 LAB — COMPLETE METABOLIC PANEL WITH GFR
ALBUMIN: 4.2 g/dL (ref 3.6–5.1)
ALK PHOS: 56 U/L (ref 40–115)
ALT: 46 U/L (ref 9–46)
AST: 34 U/L (ref 10–40)
BILIRUBIN TOTAL: 0.5 mg/dL (ref 0.2–1.2)
BUN: 7 mg/dL (ref 7–25)
CO2: 27 mmol/L (ref 20–31)
Calcium: 9.9 mg/dL (ref 8.6–10.3)
Chloride: 101 mmol/L (ref 98–110)
Creat: 0.88 mg/dL (ref 0.60–1.35)
Glucose, Bld: 83 mg/dL (ref 65–99)
POTASSIUM: 3.9 mmol/L (ref 3.5–5.3)
Sodium: 137 mmol/L (ref 135–146)
TOTAL PROTEIN: 7.5 g/dL (ref 6.1–8.1)

## 2015-02-23 LAB — TSH: TSH: 6.199 u[IU]/mL — ABNORMAL HIGH (ref 0.350–4.500)

## 2015-02-23 LAB — PRO B NATRIURETIC PEPTIDE: Pro B Natriuretic peptide (BNP): 11.92 pg/mL (ref <126)

## 2015-02-23 NOTE — Progress Notes (Signed)
Quick Note:  Thyroid labs are medium abnormal. F/u with Dr. Rexene EdisonH to confirm the lab and get further tests. Otherwise labs were normal. ______

## 2015-03-01 ENCOUNTER — Ambulatory Visit (INDEPENDENT_AMBULATORY_CARE_PROVIDER_SITE_OTHER): Payer: BLUE CROSS/BLUE SHIELD | Admitting: Family Medicine

## 2015-03-01 ENCOUNTER — Encounter: Payer: Self-pay | Admitting: Family Medicine

## 2015-03-01 VITALS — BP 131/76 | HR 88 | Wt 313.0 lb

## 2015-03-01 DIAGNOSIS — R946 Abnormal results of thyroid function studies: Secondary | ICD-10-CM

## 2015-03-01 DIAGNOSIS — R7989 Other specified abnormal findings of blood chemistry: Secondary | ICD-10-CM | POA: Insufficient documentation

## 2015-03-01 NOTE — Progress Notes (Signed)
CC: Spencer HousekeeperMichael Delgado is a 29 y.o. male is here for Follow-up   Subjective: HPI:  Follow up elevated TSH. He experienced unintentional weight gain of approx 16 pounds over six weeks per his scales.  He denies overeating and swears that he eats less than all his family and peers.  Describes actually skipping meals is not uncommon. TSH was mildly elevated one week ago.  Denies skin or hair changes, mood changes, but does endorse fatigue w/out SOB. He has never been on any thyroid medication in the past. He actually gained this weight while taking saxenda.  Review Of Systems Outlined In HPI  Past Medical History  Diagnosis Date  . Hypertension     Past Surgical History  Procedure Laterality Date  . Hernia repair     Family History  Problem Relation Age of Onset  . Heart attack      grandfather   . Hyperlipidemia Mother     Social History   Social History  . Marital Status: Married    Spouse Name: N/A  . Number of Children: N/A  . Years of Education: N/A   Occupational History  . Not on file.   Social History Main Topics  . Smoking status: Never Smoker   . Smokeless tobacco: Not on file  . Alcohol Use: 0.0 oz/week    0 Standard drinks or equivalent per week  . Drug Use: No  . Sexual Activity:    Partners: Female   Other Topics Concern  . Not on file   Social History Narrative     Objective: BP 131/76 mmHg  Pulse 88  Wt 313 lb (141.976 kg)  Vital signs reviewed. General: Alert and Oriented, No Acute Distress HEENT: Pupils equal, round, reactive to light. Conjunctivae clear.  External ears unremarkable.  Moist mucous membranes. Lungs: Clear and comfortable work of breathing, speaking in full sentences without accessory muscle use. Cardiac: Regular rate and rhythm.  Neuro: CN II-XII grossly intact, gait normal. Abdomen: Moderate obesity Extremities: No peripheral edema.  Strong peripheral pulses.  Mental Status: No depression, anxiety, nor agitation. Logical  though process. Skin: Warm and dry.  Assessment & Plan: Casimiro NeedleMichael was seen today for follow-up.  Diagnoses and all orders for this visit:  Elevated TSH -     TSH -     T4, free   Elevated TSH: Rechecking TSH today to see if this truly represents a new diagnosis of hypothyroidism or was just a transient drift from his normal. If TSH and free T4 are normal worry for will refer to a separate endocrinologist for further evaluation.  Return if symptoms worsen or fail to improve.

## 2015-03-02 ENCOUNTER — Telehealth: Payer: Self-pay | Admitting: Family Medicine

## 2015-03-02 DIAGNOSIS — R7989 Other specified abnormal findings of blood chemistry: Secondary | ICD-10-CM

## 2015-03-02 LAB — T4, FREE: Free T4: 1.15 ng/dL (ref 0.80–1.80)

## 2015-03-02 LAB — TSH: TSH: 4.761 u[IU]/mL — AB (ref 0.350–4.500)

## 2015-03-02 MED ORDER — LEVOTHYROXINE SODIUM 88 MCG PO TABS: 88.0000 ug | ORAL_TABLET | Freq: Every day | ORAL | Status: DC

## 2015-03-02 NOTE — Telephone Encounter (Signed)
Voice mail left for to return to clinic

## 2015-03-02 NOTE — Telephone Encounter (Signed)
His weight gain is influenced by an underactive thyroid which was demonstrated on his two most recent blood tests and will be addressed / treated with the new levothyroxine Rx.

## 2015-03-02 NOTE — Telephone Encounter (Signed)
Pt returned clinic call. Advised of results and recommendations as well as informed of new Rx. Pt questions if PCP still thinks his weight changes are related to his TSH levels. Will route to PCP for review, Pt would like a response. Pt states if he does not answer it is OK to leave this answer on his voicemail, phone number verified.

## 2015-03-02 NOTE — Telephone Encounter (Signed)
Pt advised.

## 2015-03-02 NOTE — Telephone Encounter (Signed)
[place], Will you please let patient know that his thyroid function appears to still be underactive so I'll send a thyroid supplement to [facility] [address]. I'd recommend he return to have these values rechecked along with his weight in two months.

## 2015-03-03 ENCOUNTER — Ambulatory Visit: Payer: Self-pay | Admitting: Family Medicine

## 2015-03-20 ENCOUNTER — Telehealth: Payer: Self-pay

## 2015-03-20 MED ORDER — FUROSEMIDE 20 MG PO TABS: ORAL_TABLET | ORAL | Status: DC

## 2015-03-20 NOTE — Telephone Encounter (Signed)
Rx of generic lasix sent to [facility] [address].

## 2015-03-20 NOTE — Telephone Encounter (Signed)
Pt advised.

## 2015-03-20 NOTE — Telephone Encounter (Signed)
Pt called reporting that he has been taking the levothyroxine for the past couple weeks and he has more energy and is more active but is still gaining weight.  He states that he has even changed his eating habits.  He said during his visit you all talked about his weight gain could possibly be from fluids, so he wants to know is this a good time for him to try lasix?

## 2015-03-27 ENCOUNTER — Telehealth: Payer: Self-pay

## 2015-03-27 DIAGNOSIS — E669 Obesity, unspecified: Secondary | ICD-10-CM

## 2015-03-27 DIAGNOSIS — E039 Hypothyroidism, unspecified: Secondary | ICD-10-CM

## 2015-03-27 NOTE — Telephone Encounter (Signed)
Pt called reporting that he doesn't see any difference in his weight while being on lasix and levothyroxine.

## 2015-03-28 NOTE — Telephone Encounter (Signed)
Pt advised.

## 2015-03-28 NOTE — Telephone Encounter (Signed)
Pt advised.  Pt wants to know should he continue to take lasix and should his levothyroxine be changed to a higher dose?

## 2015-03-28 NOTE — Telephone Encounter (Signed)
Yes lasix on an as needed basis for any sensation of fluid retention.  Continue levothyroxine at the current dose.  Then after one month of taking this he'll be due for a TSH test to see if this is the correct dosage to be on.  This would be due on or after November 20th, I'll put a lab slip up front

## 2015-03-28 NOTE — Telephone Encounter (Signed)
I'm out of ideas now on how to help with weight loss so I will place a referral to Dr. Ovidio KinBowen's office [place] in [place] who specializes in weight loss

## 2015-04-21 LAB — TSH: TSH: 1.215 u[IU]/mL (ref 0.350–4.500)

## 2015-04-25 ENCOUNTER — Telehealth: Payer: Self-pay | Admitting: Family Medicine

## 2015-04-25 DIAGNOSIS — E669 Obesity, unspecified: Secondary | ICD-10-CM

## 2015-04-25 NOTE — Telephone Encounter (Signed)
Re-referral

## 2015-06-16 ENCOUNTER — Telehealth: Payer: Self-pay | Admitting: Family Medicine

## 2015-06-16 ENCOUNTER — Other Ambulatory Visit: Payer: Self-pay

## 2015-06-16 DIAGNOSIS — R7989 Other specified abnormal findings of blood chemistry: Secondary | ICD-10-CM

## 2015-06-16 DIAGNOSIS — E291 Testicular hypofunction: Secondary | ICD-10-CM

## 2015-06-16 MED ORDER — LEVOTHYROXINE SODIUM 88 MCG PO TABS: 88.0000 ug | ORAL_TABLET | Freq: Every day | ORAL | Status: DC

## 2015-06-16 NOTE — Telephone Encounter (Signed)
Pt states he eats "2000 calories a day and doesn't know why he isn't losing weight." Pt states he thinks the TSH medication is not dosed correct, advised Pt his last lab values were back within normal range so the medication is working as intended. Pt then informed he would not be going to Dr. Ovidio Kin office nor the nutritionist. He "doesnt want surgery" and he "doesnt eat a lot." Pt wants another option for how he can lose weight. Pt then questions restarting on testosterone injections. Advised we would need to recheck his levels since the last blood work is almost a year old. New lab order placed. Pt advised we would contact him regarding those results.

## 2015-06-16 NOTE — Telephone Encounter (Signed)
Agree with Kelsi's recommendations, I'll try to remember to offer him options like Saxenda, Contrave, etc... When we get these results back.  (Please note patient has also refused to follow up with endocrinology regarding difficulty with weight loss)

## 2015-09-21 ENCOUNTER — Ambulatory Visit (INDEPENDENT_AMBULATORY_CARE_PROVIDER_SITE_OTHER): Payer: BLUE CROSS/BLUE SHIELD | Admitting: Family Medicine

## 2015-09-21 ENCOUNTER — Encounter: Payer: Self-pay | Admitting: Family Medicine

## 2015-09-21 VITALS — BP 146/89 | HR 93 | Wt 315.0 lb

## 2015-09-21 DIAGNOSIS — G4733 Obstructive sleep apnea (adult) (pediatric): Secondary | ICD-10-CM

## 2015-09-21 DIAGNOSIS — Z9989 Dependence on other enabling machines and devices: Secondary | ICD-10-CM

## 2015-09-21 DIAGNOSIS — R946 Abnormal results of thyroid function studies: Secondary | ICD-10-CM | POA: Diagnosis not present

## 2015-09-21 DIAGNOSIS — R7989 Other specified abnormal findings of blood chemistry: Secondary | ICD-10-CM

## 2015-09-21 DIAGNOSIS — Z0000 Encounter for general adult medical examination without abnormal findings: Secondary | ICD-10-CM | POA: Diagnosis not present

## 2015-09-21 NOTE — Progress Notes (Signed)
CC: Spencer Delgado is a 30 y.o. male is here for Hypothyroidism and Obesity   Subjective: HPI:  Colonoscopy: No current indication Prostate: Discussed screening risks/beneifts with patient today, no current indication  Influenza Vaccine: No current indication Pneumovax: No current indication Td/Tdap: Up-to-date from last year Zoster: (Start 32 yo)  Requesting complete physical exam  He still having difficulty losing weight. He is interested in possibly restarting testosterone if his level is still low.  Review of Systems - General ROS: negative for - chills, fever, night sweats, weight gain or weight loss Ophthalmic ROS: negative for - decreased vision Psychological ROS: negative for - anxiety or depression ENT ROS: negative for - hearing change, nasal congestion, tinnitus or allergies Hematological and Lymphatic ROS: negative for - bleeding problems, bruising or swollen lymph nodes Breast ROS: negative Respiratory ROS: no cough, shortness of breath, or wheezing Cardiovascular ROS: no chest pain or dyspnea on exertion Gastrointestinal ROS: no abdominal pain, change in bowel habits, or black or bloody stools Genito-Urinary ROS: negative for - genital discharge, genital ulcers, incontinence or abnormal bleeding from genitals Musculoskeletal ROS: negative for - joint pain or muscle pain Neurological ROS: negative for - headaches or memory loss Dermatological ROS: negative for lumps, mole changes, rash and skin lesion changes  Past Medical History  Diagnosis Date  . Hypertension     Past Surgical History  Procedure Laterality Date  . Hernia repair     Family History  Problem Relation Age of Onset  . Heart attack      grandfather   . Hyperlipidemia Mother     Social History   Social History  . Marital Status: Married    Spouse Name: N/A  . Number of Children: N/A  . Years of Education: N/A   Occupational History  . Not on file.   Social History Main Topics  .  Smoking status: Never Smoker   . Smokeless tobacco: Not on file  . Alcohol Use: 0.0 oz/week    0 Standard drinks or equivalent per week  . Drug Use: No  . Sexual Activity:    Partners: Female   Other Topics Concern  . Not on file   Social History Narrative     Objective: BP 146/89 mmHg  Pulse 93  Wt 315 lb (142.883 kg)  General: No Acute Distress HEENT: Atraumatic, normocephalic, conjunctivae normal without scleral icterus.  No nasal discharge, hearing grossly intact, TMs with good landmarks bilaterally with no middle ear abnormalities, posterior pharynx clear without oral lesions. Neck: Supple, trachea midline, no cervical nor supraclavicular adenopathy. Pulmonary: Clear to auscultation bilaterally without wheezing, rhonchi, nor rales. Cardiac: Regular rate and rhythm.  No murmurs, rubs, nor gallops. No peripheral edema.  2+ peripheral pulses bilaterally. Abdomen: Bowel sounds normal.  No masses.  Non-tender without rebound.  Negative Murphy's sign. MSK: Grossly intact, no signs of weakness.  Full strength throughout upper and lower extremities.  Full ROM in upper and lower extremities.  No midline spinal tenderness. Neuro: Gait unremarkable, CN II-XII grossly intact.  C5-C6 Reflex 2/4 Bilaterally, L4 Reflex 2/4 Bilaterally.  Cerebellar function intact. Skin: No rashes. Psych: Alert and oriented to person/place/time.  Thought process normal. No anxiety/depression. Assessment & Plan: Spencer Delgado was seen today for hypothyroidism and obesity.  Diagnoses and all orders for this visit:  Annual physical exam -     Testosterone -     TSH -     T4, free -     T3, free -  COMPLETE METABOLIC PANEL WITH GFR -     CBC  Elevated TSH  OSA on CPAP   Healthy lifestyle interventions including but not limited to regular exercise, a healthy low fat diet, moderation of salt intake, the dangers of tobacco/alcohol/recreational drug use, nutrition supplementation, and accident avoidance were  discussed with the patient and a handout was provided for future reference. I'm open to the idea of restarting testosterone at a slightly higher dose than his last regimen if testosterone is still deficient today. He once his CPAP machine is actually helping, I have asked him to provide a download report from his CPAP machine which should help determine what his AHI is   Return if symptoms worsen or fail to improve.

## 2015-09-22 ENCOUNTER — Telehealth: Payer: Self-pay | Admitting: Family Medicine

## 2015-09-22 LAB — CBC
HCT: 46.1 % (ref 38.5–50.0)
Hemoglobin: 15.8 g/dL (ref 13.2–17.1)
MCH: 28.4 pg (ref 27.0–33.0)
MCHC: 34.3 g/dL (ref 32.0–36.0)
MCV: 82.8 fL (ref 80.0–100.0)
MPV: 9.8 fL (ref 7.5–12.5)
Platelets: 386 10*3/uL (ref 140–400)
RBC: 5.57 MIL/uL (ref 4.20–5.80)
RDW: 15 % (ref 11.0–15.0)
WBC: 8 10*3/uL (ref 3.8–10.8)

## 2015-09-22 LAB — COMPLETE METABOLIC PANEL WITH GFR
ALT: 45 U/L (ref 9–46)
AST: 31 U/L (ref 10–40)
Albumin: 4.6 g/dL (ref 3.6–5.1)
Alkaline Phosphatase: 67 U/L (ref 40–115)
BUN: 12 mg/dL (ref 7–25)
CALCIUM: 10.1 mg/dL (ref 8.6–10.3)
CHLORIDE: 103 mmol/L (ref 98–110)
CO2: 20 mmol/L (ref 20–31)
CREATININE: 1.06 mg/dL (ref 0.60–1.35)
Glucose, Bld: 81 mg/dL (ref 65–99)
POTASSIUM: 4.1 mmol/L (ref 3.5–5.3)
Sodium: 139 mmol/L (ref 135–146)
Total Bilirubin: 0.6 mg/dL (ref 0.2–1.2)
Total Protein: 8.1 g/dL (ref 6.1–8.1)

## 2015-09-22 LAB — T4, FREE: FREE T4: 1.2 ng/dL (ref 0.8–1.8)

## 2015-09-22 LAB — TSH: TSH: 2.1 m[IU]/L (ref 0.40–4.50)

## 2015-09-22 LAB — TESTOSTERONE: TESTOSTERONE: 234 ng/dL — AB (ref 250–827)

## 2015-09-22 LAB — T3, FREE: T3, Free: 2.9 pg/mL (ref 2.3–4.2)

## 2015-09-22 MED ORDER — AMBULATORY NON FORMULARY MEDICATION: Status: DC

## 2015-09-22 MED ORDER — TESTOSTERONE CYPIONATE 200 MG/ML IM SOLN: INTRAMUSCULAR | Status: DC

## 2015-09-22 NOTE — Telephone Encounter (Signed)
Will you please let patient know that his testosterone level was deficient as we predicted. I've printed off a new Rx for testosterone, can you please also print him off a Rx for needles and syringes quantity 4 with 2 refills?

## 2015-09-22 NOTE — Telephone Encounter (Signed)
Pt notified. Testosterone,needles and syringes faxed

## 2015-09-26 ENCOUNTER — Telehealth: Payer: Self-pay | Admitting: *Deleted

## 2015-09-26 NOTE — Telephone Encounter (Signed)
PA initiated and approved [phone]( [phone]) approved for 3 years. PA # [id number]  Pt notified and pharm notified

## 2015-10-24 ENCOUNTER — Other Ambulatory Visit: Payer: Self-pay | Admitting: Family Medicine

## 2015-11-02 ENCOUNTER — Ambulatory Visit: Payer: Self-pay | Admitting: Osteopathic Medicine

## 2015-11-03 ENCOUNTER — Ambulatory Visit (INDEPENDENT_AMBULATORY_CARE_PROVIDER_SITE_OTHER): Payer: BLUE CROSS/BLUE SHIELD | Admitting: Osteopathic Medicine

## 2015-11-03 ENCOUNTER — Encounter: Payer: Self-pay | Admitting: Osteopathic Medicine

## 2015-11-03 ENCOUNTER — Ambulatory Visit: Payer: Self-pay | Admitting: Family Medicine

## 2015-11-03 ENCOUNTER — Encounter: Payer: Self-pay | Admitting: Family Medicine

## 2015-11-03 VITALS — BP 149/99 | HR 90 | Ht 71.0 in | Wt 319.0 lb

## 2015-11-03 DIAGNOSIS — G43A0 Cyclical vomiting, not intractable: Secondary | ICD-10-CM | POA: Diagnosis not present

## 2015-11-03 DIAGNOSIS — R109 Unspecified abdominal pain: Secondary | ICD-10-CM | POA: Diagnosis not present

## 2015-11-03 DIAGNOSIS — R1110 Vomiting, unspecified: Secondary | ICD-10-CM

## 2015-11-03 DIAGNOSIS — E039 Hypothyroidism, unspecified: Secondary | ICD-10-CM

## 2015-11-03 DIAGNOSIS — R112 Nausea with vomiting, unspecified: Secondary | ICD-10-CM

## 2015-11-03 DIAGNOSIS — I10 Essential (primary) hypertension: Secondary | ICD-10-CM

## 2015-11-03 DIAGNOSIS — IMO0001 Reserved for inherently not codable concepts without codable children: Secondary | ICD-10-CM

## 2015-11-03 DIAGNOSIS — R197 Diarrhea, unspecified: Secondary | ICD-10-CM

## 2015-11-03 DIAGNOSIS — E291 Testicular hypofunction: Secondary | ICD-10-CM

## 2015-11-03 DIAGNOSIS — R1115 Cyclical vomiting syndrome unrelated to migraine: Secondary | ICD-10-CM

## 2015-11-03 DIAGNOSIS — R1084 Generalized abdominal pain: Secondary | ICD-10-CM | POA: Diagnosis not present

## 2015-11-03 LAB — CBC WITH DIFFERENTIAL/PLATELET
BASOS ABS: 0 {cells}/uL (ref 0–200)
BASOS PCT: 0 %
EOS ABS: 192 {cells}/uL (ref 15–500)
Eosinophils Relative: 3 %
HEMATOCRIT: 45.4 % (ref 38.5–50.0)
HEMOGLOBIN: 15.5 g/dL (ref 13.2–17.1)
Lymphocytes Relative: 27 %
Lymphs Abs: 1728 cells/uL (ref 850–3900)
MCH: 28.7 pg (ref 27.0–33.0)
MCHC: 34.1 g/dL (ref 32.0–36.0)
MCV: 84.1 fL (ref 80.0–100.0)
MONO ABS: 576 {cells}/uL (ref 200–950)
MONOS PCT: 9 %
MPV: 9.6 fL (ref 7.5–12.5)
NEUTROS ABS: 3904 {cells}/uL (ref 1500–7800)
Neutrophils Relative %: 61 %
PLATELETS: 352 10*3/uL (ref 140–400)
RBC: 5.4 MIL/uL (ref 4.20–5.80)
RDW: 15.3 % — ABNORMAL HIGH (ref 11.0–15.0)
WBC: 6.4 10*3/uL (ref 3.8–10.8)

## 2015-11-03 LAB — COMPLETE METABOLIC PANEL WITH GFR
ALBUMIN: 4.2 g/dL (ref 3.6–5.1)
ALK PHOS: 66 U/L (ref 40–115)
ALT: 28 U/L (ref 9–46)
AST: 21 U/L (ref 10–40)
BILIRUBIN TOTAL: 0.5 mg/dL (ref 0.2–1.2)
BUN: 8 mg/dL (ref 7–25)
CALCIUM: 9.2 mg/dL (ref 8.6–10.3)
CO2: 25 mmol/L (ref 20–31)
CREATININE: 0.91 mg/dL (ref 0.60–1.35)
Chloride: 104 mmol/L (ref 98–110)
Glucose, Bld: 100 mg/dL — ABNORMAL HIGH (ref 65–99)
Potassium: 4.5 mmol/L (ref 3.5–5.3)
Sodium: 139 mmol/L (ref 135–146)
TOTAL PROTEIN: 7.3 g/dL (ref 6.1–8.1)

## 2015-11-03 LAB — TSH: TSH: 1.53 m[IU]/L (ref 0.40–4.50)

## 2015-11-03 LAB — LIPASE: LIPASE: 7 U/L (ref 7–60)

## 2015-11-03 MED ORDER — DICYCLOMINE HCL 20 MG PO TABS: 20.0000 mg | ORAL_TABLET | Freq: Three times a day (TID) | ORAL | Status: DC

## 2015-11-03 MED ORDER — ONDANSETRON 8 MG PO TBDP: 8.0000 mg | ORAL_TABLET | Freq: Three times a day (TID) | ORAL | Status: DC | PRN

## 2015-11-03 NOTE — Progress Notes (Signed)
HPI: Spencer Delgado is a 30 y.o. male who [facility] today for chief complaint of:  Chief Complaint  Patient presents with  . Abdominal Pain    for a couple of weeks  . Emesis     . Duration: few weeks ago (+) BRblood in stool, didn't worry him too much, stopped. More recently eating causes bloating and upset stomach and will regurgitate food 20 minutes later or so.  . Timing:Almost every meal. Includes liquids  . Modifying factors: worried T shots and thyroid meds, not lost any weight.  . Assoc signs/symptoms: no fever/chills.    Past medical, social and family history reviewed: Past Medical History  Diagnosis Date  . Hypertension    Past Surgical History  Procedure Laterality Date  . Hernia repair     Social History  Substance Use Topics  . Smoking status: Never Smoker   . Smokeless tobacco: Not on file  . Alcohol Use: 0.0 oz/week    0 Standard drinks or equivalent per week   Family History  Problem Relation Age of Onset  . Heart attack      grandfather   . Hyperlipidemia Mother     Current Outpatient Prescriptions  Medication Sig Dispense Refill  . AMBULATORY NON FORMULARY MEDICATION 3 ml syringes Use as directed 4 each 2  . AMBULATORY NON FORMULARY MEDICATION 22g 1 1/2 in needles Use to administer intramuscular injection 4 each 2  . AMBULATORY NON FORMULARY MEDICATION Blunt fill needles 18g 1 1/2 in Use to draw medication in syringe 4 each 2  . levothyroxine (SYNTHROID, LEVOTHROID) 88 MCG tablet TAKE ONE TABLET BY MOUTH ONCE DAILY 30 tablet 0  . testosterone cypionate (DEPO-TESTOSTERONE) 200 MG/ML injection Inject 1.2575mL IM every 14 days. 4 mL 4   No current facility-administered medications for this visit.   Allergies  Allergen Reactions  . Morphine And Related Other (See Comments)    Stops heart      Review of Systems: CONSTITUTIONAL:  No  fever, no chills, No recent illness, (+) unintentional weight  changes - gain CARDIAC: No  chest pain, No  pressure, No palpitations RESPIRATORY: No  cough, No  shortness of breath/wheeze GASTROINTESTINAL: (+) nausea, (+) vomiting, (+) abdominal pain, No  blood in stool, (+) diarrhea, No  constipation  MUSCULOSKELETAL: No  myalgia/arthralgia SKIN: No  rash/wounds/concerning lesions HEM/ONC: No  easy bruising/bleeding, No  abnormal lymph node NEUROLOGIC: No  weakness, No  dizziness, No  slurred speech   Exam:  BP 154/94 mmHg  Pulse 85  Ht 5\' 11"  (1.803 m)  Wt 319 lb (144.697 kg)  BMI 44.51 kg/m2 Constitutional: VS see above. General Appearance: alert, well-developed, well-nourished, NAD Eyes: Normal lids and conjunctive, non-icteric sclera, PERRLA Ears, Nose, Mouth, Throat: MMM, Normal external inspection ears/nares/mouth/lips/gums, TM normal bilaterally. Pharynx/tonsils no erythema, no exudate. Nasal mucosa normal.  Neck: No masses, trachea midline. No thyroid enlargement. No tenderness/mass appreciated. No lymphadenopathy, habitus limits exam Respiratory: Normal respiratory effort. no wheeze, no rhonchi, no rales Cardiovascular: S1/S2 normal, no murmur, no rub/gallop auscultated. RRR. No lower extremity edema. Gastrointestinal: (+) diffuse tenderness, obesity limits exam, no rebound/guarding, no masses. No hepatomegaly, no splenomegaly. No hernia appreciated. Bowel sounds normal. Rectal exam deferred.  Musculoskeletal: Gait normal. No clubbing/cyanosis of digits.  Neurological: No cranial nerve deficit on limited exam. Motor and sensation intact and symmetric Skin: warm, dry, intact. No rash/ulcer. No concerning nevi or subq nodules on limited exam.   Psychiatric: Normal judgment/insight.  Normal mood and affect. Oriented x3.     ASSESSMENT/PLAN:  Non-intractable cyclical vomiting with nausea - w/u for obstruction, ER precautions reviewed, GI referral placed - Plan: ondansetron (ZOFRAN ODT) 8 MG disintegrating tablet, TSH, Ambulatory referral to  Gastroenterology, DG UGI  W/KUB  Frequent loose stools - Consider IBS, try Imodium, increase oral hydration, GI referral placed, see med list  Generalized abdominal pain - bloating/nonsepcific pain, no rebound/guarding, imaging pending, GI referral placed, ER precautions reviewed - Plan: CBC with Differential/Platelet, COMPLETE METABOLIC PANEL WITH GFR, Lipase, DG UGI  W/KUB  Abdominal cramping - Plan: dicyclomine (BENTYL) 20 MG tablet  Regurgitation - w/u for obstruction, consider esophageal cause, hiatal hernia, other.  - Plan: Ambulatory referral to Gastroenterology, DG UGI  W/KUB  Hypothyroidism, unspecified hypothyroidism type  Essential hypertension - on T, need to keep an eye on BP, elevated today but pt reports significant anxiety d/t GI symptoms, f/u 2 weeks w/ PCP  Hypogonadism in male - on T, need to keep an eye on BP, elevated today but pt reports significant anxiety d/t GI symptoms, f/u 2 weeks w/ PCP     All questions were answered. Visit summary with medication list and pertinent instructions was printed for patient to review. ER/RTC precautions were reviewed with the patient. Return in about 2 weeks (around 11/17/2015), or sooner if needed, for FOLLOW UP WITH PCP - RECHECK BLOOD PRESSURE AND OTHER SYMPTOMS.

## 2015-11-03 NOTE — Patient Instructions (Signed)
1. Labs 2. Xray abdomen and may have you swallow contrast to evaluate for obstruction 3. Pharmacy - Dicyclomine for abdominal cramping, Ondansetron for nausea, can take liquid Pepto Bismol or Immodium as needed for diarrhea or mild nausea/heartburn 4. Referral to GI - let us know if you don't hear back from them by next Tuesday  Abdominal Pain, Adult Many things can cause abdominal pain. Usually, abdominal pain is not caused by a disease and will improve without treatment. It can often be observed and treated at home. Your health care provider will do a physical exam and possibly order blood tests and X-rays to help determine the seriousness of your pain. However, in many cases, more time must pass before a clear cause of the pain can be found. Before that point, your health care provider may not know if you need more testing or further treatment. HOME CARE INSTRUCTIONS Monitor your abdominal pain for any changes. The following actions may help to alleviate any discomfort you are experiencing:  Only take over-the-counter or prescription medicines as directed by your health care provider.  Do not take laxatives unless directed to do so by your health care provider.  Try a clear liquid diet (broth, tea, or water) as directed by your health care provider. Slowly move to a bland diet as tolerated. SEEK MEDICAL CARE IF:  You have unexplained abdominal pain.  You have abdominal pain associated with nausea or diarrhea.  You have pain when you urinate or have a bowel movement.  You experience abdominal pain that wakes you in the night.  You have abdominal pain that is worsened or improved by eating food.  You have abdominal pain that is worsened with eating fatty foods.  You have a fever. SEEK IMMEDIATE MEDICAL CARE IF:  Your pain is severe and does not go away within 2 hours.  You keep throwing up (vomiting), especially vomit that looks like blood, green bile, or dark coffee grounds.    Your pain is felt only in portions of the abdomen, such as the right side or the left lower portion of the abdomen.  You pass black tarry stools. MAKE SURE YOU:  Understand these instructions.  Will watch your condition.  Will get help right away if you are not doing well or get worse.   This information is not intended to replace advice given to you by your health care provider. Make sure you discuss any questions you have with your health care provider.   Document Released: 02/06/2005 Document Revised: 01/18/2015 Document Reviewed: 01/06/2013 Elsevier Interactive Patient Education [employer]2016 Elsevier Inc.

## 2015-11-03 NOTE — Progress Notes (Signed)
Over 15 minutes late, not charged for no show but will count towards 1 of 3 no shows.

## 2015-11-06 ENCOUNTER — Encounter: Payer: Self-pay | Admitting: Family Medicine

## 2015-11-06 DIAGNOSIS — IMO0001 Reserved for inherently not codable concepts without codable children: Secondary | ICD-10-CM | POA: Insufficient documentation

## 2015-11-06 DIAGNOSIS — R1110 Vomiting, unspecified: Secondary | ICD-10-CM

## 2015-11-16 ENCOUNTER — Ambulatory Visit: Payer: Self-pay | Admitting: Family Medicine

## 2015-12-22 ENCOUNTER — Other Ambulatory Visit: Payer: Self-pay | Admitting: Family Medicine

## 2016-01-08 ENCOUNTER — Ambulatory Visit (INDEPENDENT_AMBULATORY_CARE_PROVIDER_SITE_OTHER): Payer: BLUE CROSS/BLUE SHIELD | Admitting: Family Medicine

## 2016-01-08 VITALS — BP 140/87 | HR 71 | Temp 98.5°F | Resp 18 | Wt 316.1 lb

## 2016-01-08 DIAGNOSIS — A084 Viral intestinal infection, unspecified: Secondary | ICD-10-CM | POA: Diagnosis not present

## 2016-01-08 NOTE — Patient Instructions (Signed)
Thank you for coming in today. If your belly pain worsens, or you have high fever, bad vomiting, blood in your stool or black tarry stool go to the Emergency Room.   Viral Gastroenteritis Viral gastroenteritis is also known as stomach flu. This condition affects the stomach and intestinal tract. It can cause sudden diarrhea and vomiting. The illness typically lasts 3 to 8 days. Most people develop an immune response that eventually gets rid of the virus. While this natural response develops, the virus can make you quite ill. CAUSES  Many different viruses can cause gastroenteritis, such as rotavirus or noroviruses. You can catch one of these viruses by consuming contaminated food or water. You may also catch a virus by sharing utensils or other personal items with an infected person or by touching a contaminated surface. SYMPTOMS  The most common symptoms are diarrhea and vomiting. These problems can cause a severe loss of body fluids (dehydration) and a body salt (electrolyte) imbalance. Other symptoms may include:  Fever.  Headache.  Fatigue.  Abdominal pain. DIAGNOSIS  Your caregiver can usually diagnose viral gastroenteritis based on your symptoms and a physical exam. A stool sample may also be taken to test for the presence of viruses or other infections. TREATMENT  This illness typically goes away on its own. Treatments are aimed at rehydration. The most serious cases of viral gastroenteritis involve vomiting so severely that you are not able to keep fluids down. In these cases, fluids must be given through an intravenous line (IV). HOME CARE INSTRUCTIONS   Drink enough fluids to keep your urine clear or pale yellow. Drink small amounts of fluids frequently and increase the amounts as tolerated.  Ask your caregiver for specific rehydration instructions.  Avoid:  Foods high in sugar.  Alcohol.  Carbonated drinks.  Tobacco.  Juice.  Caffeine drinks.  Extremely hot or cold  fluids.  Fatty, greasy foods.  Too much intake of anything at one time.  Dairy products until 24 to 48 hours after diarrhea stops.  You may consume probiotics. Probiotics are active cultures of beneficial bacteria. They may lessen the amount and number of diarrheal stools in adults. Probiotics can be found in yogurt with active cultures and in supplements.  Wash your hands well to avoid spreading the virus.  Only take over-the-counter or prescription medicines for pain, discomfort, or fever as directed by your caregiver. Do not give aspirin to children. Antidiarrheal medicines are not recommended.  Ask your caregiver if you should continue to take your regular prescribed and over-the-counter medicines.  Keep all follow-up appointments as directed by your caregiver. SEEK IMMEDIATE MEDICAL CARE IF:   You are unable to keep fluids down.  You do not urinate at least once every 6 to 8 hours.  You develop shortness of breath.  You notice blood in your stool or vomit. This may look like coffee grounds.  You have abdominal pain that increases or is concentrated in one small area (localized).  You have persistent vomiting or diarrhea.  You have a fever.  The patient is a child younger than 3 months, and he or she has a fever.  The patient is a child older than 3 months, and he or she has a fever and persistent symptoms.  The patient is a child older than 3 months, and he or she has a fever and symptoms suddenly get worse.  The patient is a baby, and he or she has no tears when crying. MAKE SURE YOU:     Understand these instructions.  Will watch your condition.  Will get help right away if you are not doing well or get worse.   This information is not intended to replace advice given to you by your health care provider. Make sure you discuss any questions you have with your health care provider.   Document Released: 04/29/2005 Document Revised: 07/22/2011 Document Reviewed:  02/13/2011 Elsevier Interactive Patient Education [employer] Elsevier Inc.

## 2016-01-08 NOTE — Progress Notes (Signed)
Spencer Delgado is a 30 y.o. male who presents to [facility]: [facility] today with a 4 day history of fatigue, malaise, nausea, vomiting, and diarrhea.  Patient reports his symptoms began 4 days ago with fatigue, malaise, and body aches.  Over the last couple of days his illness has progressed to nausea, vomiting, and diarrhea.  His last episode of vomiting was yesterday.  He endorses a decreased appetite but claims he is able to tolerate drinking water and Gatorade.  His illness has caused him to miss work the last couple of days.  Patient reports his son had a viral GI bug last week with similar symptoms and is concerned he has the same thing.  He denies abdominal pain, fever, bloody stools, hematemesis, and syncope.  No other complaints.     Past Medical History:  Diagnosis Date  . Hypertension    Past Surgical History:  Procedure Laterality Date  . HERNIA REPAIR     Social History  Substance Use Topics  . Smoking status: Never Smoker  . Smokeless tobacco: Not on file  . Alcohol use 0.0 oz/week   family history includes Hyperlipidemia in his mother.  ROS as above: No visual changes, constipation, dizziness, abdominal pain, skin rash, fevers, chills, night sweats, weight loss, swollen lymph nodes,  joint swelling, chest pain, shortness of breath, mood changes, visual or auditory hallucinations.    Medications: Current Outpatient Prescriptions  Medication Sig Dispense Refill  . AMBULATORY NON FORMULARY MEDICATION 3 ml syringes Use as directed 4 each 2  . AMBULATORY NON FORMULARY MEDICATION 22g 1 1/2 in needles Use to administer intramuscular injection 4 each 2  . AMBULATORY NON FORMULARY MEDICATION Blunt fill needles 18g 1 1/2 in Use to draw medication in syringe 4 each 2  . dicyclomine (BENTYL) 20 MG tablet Take 1 tablet (20 mg total) by mouth 3 (three) times daily  before meals. 45 tablet 0  . levothyroxine (SYNTHROID, LEVOTHROID) 88 MCG tablet TAKE ONE TABLET BY MOUTH ONCE DAILY 30 tablet 0  . ondansetron (ZOFRAN ODT) 8 MG disintegrating tablet Take 1 tablet (8 mg total) by mouth every 8 (eight) hours as needed for nausea or vomiting. 20 tablet 2  . testosterone cypionate (DEPO-TESTOSTERONE) 200 MG/ML injection Inject 1.32mL IM every 14 days. 4 mL 4   No current facility-administered medications for this visit.    Allergies  Allergen Reactions  . Morphine And Related Other (See Comments)    Stops heart     Exam:  BP 140/87 (BP Location: Right Arm, Patient Position: Sitting, Cuff Size: Large)   Pulse 71   Temp 98.5 F (36.9 C) (Oral)   Resp 18   Wt (!) 316 lb 1.9 oz (143.4 kg)   SpO2 98%   BMI 44.09 kg/m  Gen: Well NAD, nontoxic appearing HEENT: EOMI,  MMM Lungs: Normal work of breathing. CTABL Heart: RRR no MRG Abd: Hyperactive bowel sounds, Soft. Nondistended, Nontender. No Rebound or guarding. Exts: Brisk capillary refill, warm and well perfused.   No results found for this or any previous visit (from the past 24 hour(s)). No results found.    Assessment and Plan: 30 y.o. male with a four day history of fatigue and malaise with subsequent vomiting and diarrhea concerning for viral gastroenteritis.  Patient is nontoxic appearing with normal vital signs.  He is able to hydrate well at home so the patient does not need IV fluids at this time. Continue  Zofran and Imodium as needed   - Continue hydrating  - Work note given to return in 1-3 days when he feels better - Follow up if not improved over the next 3 days    No orders of the defined types were placed in this encounter.   Discussed warning signs or symptoms. Please see discharge instructions. Patient expresses understanding.

## 2016-01-08 NOTE — Progress Notes (Signed)
Pt here for illness.  Says that son had virus last week, towards the end of the week, he felt weak, run down, and vomiting.  Last time he vomited was yesterday afternoon and has not had anything to eat since. Orthostatic BP Lying 145/96-96%-74 Sitting - 146/90-98%-81 Standing- 149/95-96%-84

## 2016-02-06 ENCOUNTER — Other Ambulatory Visit: Payer: Self-pay | Admitting: Family Medicine

## 2016-02-07 ENCOUNTER — Other Ambulatory Visit: Payer: Self-pay

## 2016-02-07 MED ORDER — AMBULATORY NON FORMULARY MEDICATION: 2 refills | Status: DC

## 2016-02-07 MED ORDER — AMBULATORY NON FORMULARY MEDICATION
2 refills | Status: DC
Start: 2016-02-07 — End: 2020-03-31

## 2016-02-12 ENCOUNTER — Other Ambulatory Visit: Payer: Self-pay | Admitting: Family Medicine

## 2016-02-15 ENCOUNTER — Ambulatory Visit (INDEPENDENT_AMBULATORY_CARE_PROVIDER_SITE_OTHER): Payer: BLUE CROSS/BLUE SHIELD | Admitting: Family Medicine

## 2016-02-15 ENCOUNTER — Encounter: Payer: Self-pay | Admitting: Family Medicine

## 2016-02-15 VITALS — BP 149/96 | HR 97 | Wt 323.0 lb

## 2016-02-15 DIAGNOSIS — G4733 Obstructive sleep apnea (adult) (pediatric): Secondary | ICD-10-CM

## 2016-02-15 DIAGNOSIS — R7989 Other specified abnormal findings of blood chemistry: Secondary | ICD-10-CM

## 2016-02-15 DIAGNOSIS — I10 Essential (primary) hypertension: Secondary | ICD-10-CM

## 2016-02-15 DIAGNOSIS — E291 Testicular hypofunction: Secondary | ICD-10-CM | POA: Diagnosis not present

## 2016-02-15 DIAGNOSIS — R946 Abnormal results of thyroid function studies: Secondary | ICD-10-CM | POA: Diagnosis not present

## 2016-02-15 DIAGNOSIS — Z9989 Dependence on other enabling machines and devices: Secondary | ICD-10-CM

## 2016-02-15 MED ORDER — LEVOTHYROXINE SODIUM 88 MCG PO TABS: 88.0000 ug | ORAL_TABLET | Freq: Every day | ORAL | 0 refills | Status: DC

## 2016-02-15 NOTE — Progress Notes (Signed)
       Spencer Delgado is a 30 y.o. male who [facility] today to establish care.  He is a previous patient of Dr. Ivan AnchorsHommel.  The following health issues were addressed during the visit.  1.  Abnormal weight gain:  Patient has a history of hypothyroid and low testosterone.  Taking Synthroid 88 mcg daily for the last year. Patient endorses 100 pound weight gain during the past year or two.  He reports feeling more fatigued than normal as well.  He denies changes in activity level or diet that could contribute to abnormal weight gain.  Patient claims he eats about 2,000 calories per day and is fairly active with his kids.    2.  OSA:  Using a CPAP nightly which helps him significantly.  Patient claims he needs to refit the mask because of his recent weight gain.  3.  Health maintenance:  Declines flu shot.    Past Medical History:  Diagnosis Date  . Hypertension    Past Surgical History:  Procedure Laterality Date  . HERNIA REPAIR     Social History  Substance Use Topics  . Smoking status: Never Smoker  . Smokeless tobacco: Not on file  . Alcohol use 0.0 oz/week   family history includes Hyperlipidemia in his mother.  ROS as above:  Medications: Current Outpatient Prescriptions  Medication Sig Dispense Refill  . AMBULATORY NON FORMULARY MEDICATION 3 ml syringes Use as directed 4 each 2  . AMBULATORY NON FORMULARY MEDICATION 22g 1 1/2 in needles Use to administer intramuscular injection 4 each 2  . AMBULATORY NON FORMULARY MEDICATION Blunt fill needles 18g 1 1/2 in Use to draw medication in syringe 4 each 2  . levothyroxine (SYNTHROID, LEVOTHROID) 88 MCG tablet Take 1 tablet (88 mcg total) by mouth daily. NEED FOLLOW UP APPOINTMENT FOR MORE REFILLS 30 tablet 0  . testosterone cypionate (DEPO-TESTOSTERONE) 200 MG/ML injection Inject 1.5275mL IM every 14  days. 4 mL 4   No current facility-administered medications for this visit.    Allergies  Allergen Reactions  . Morphine And Related Other (See Comments)    Stops heart     Exam:  BP (!) 149/96   Pulse 97   Wt (!) 323 lb (146.5 kg)   BMI 45.05 kg/m  Gen: Well NAD, obese Lungs: Normal work of breathing. CTABL Heart: RRR no MRG Abd: NABS, Soft. Nondistended, Nontender Exts: Brisk capillary refill, warm and well perfused.   No results found for this or any previous visit (from the past 24 hour(s)). No results found.    Assessment and Plan: 30 y.o. male with abnormal weight gain: Likely multifactorial given his hypothyroid and low testosterone - We will check thyroid labs, testosterone, and cortisol - CBC, CMP, A1c, lipids, Vitamin D  Morbid obesity is an established problem that has worsened with unclear prognosis.   Orders Placed This Encounter  Procedures  . CBC  . COMPLETE METABOLIC PANEL WITH GFR  . Hemoglobin A1c  . Lipid panel  . LDL cholesterol, direct  . T4, free  . T3, free  . TSH  . Testosterone Total,Free,Bio, Males  . VITAMIN D 25 Hydroxy (Vit-D Deficiency, Fractures)  . Cortisol, free, Serum    Discussed warning signs or symptoms. Please see discharge instructions. Patient expresses understanding.

## 2016-02-15 NOTE — Patient Instructions (Signed)
Thank you for coming in today. Get labs 1 week after testosterone shot.  Labs should be fasting in the morning before 10 am.

## 2016-02-19 ENCOUNTER — Telehealth: Payer: Self-pay

## 2016-02-19 DIAGNOSIS — G4733 Obstructive sleep apnea (adult) (pediatric): Secondary | ICD-10-CM

## 2016-02-19 DIAGNOSIS — Z9989 Dependence on other enabling machines and devices: Secondary | ICD-10-CM

## 2016-02-19 DIAGNOSIS — E291 Testicular hypofunction: Secondary | ICD-10-CM

## 2016-02-19 DIAGNOSIS — I10 Essential (primary) hypertension: Secondary | ICD-10-CM

## 2016-02-19 NOTE — Telephone Encounter (Signed)
Pts wife left VM on Friday afternoon stating that he would like to proceed with being referred to a nutritionist or dietician. Called back and left VM advising that a referral would be ordered and pt would be contacted. Please place order if appropriate.

## 2016-02-20 NOTE — Telephone Encounter (Signed)
Referral ordered

## 2016-02-22 NOTE — Telephone Encounter (Signed)
Pt.notified

## 2016-02-26 ENCOUNTER — Encounter: Payer: Self-pay | Admitting: Osteopathic Medicine

## 2016-02-26 ENCOUNTER — Ambulatory Visit (INDEPENDENT_AMBULATORY_CARE_PROVIDER_SITE_OTHER): Payer: BLUE CROSS/BLUE SHIELD | Admitting: Osteopathic Medicine

## 2016-02-26 VITALS — BP 133/88 | HR 89 | Temp 98.7°F | Ht 71.0 in | Wt 312.0 lb

## 2016-02-26 DIAGNOSIS — H1031 Unspecified acute conjunctivitis, right eye: Secondary | ICD-10-CM

## 2016-02-26 MED ORDER — CIPROFLOXACIN HCL 0.3 % OP SOLN: 2.0000 [drp] | OPHTHALMIC | 0 refills | Status: DC

## 2016-02-26 NOTE — Patient Instructions (Signed)
Viral Conjunctivitis Viral conjunctivitis is an inflammation of the clear membrane that covers the white part of your eye and the inner surface of your eyelid (conjunctiva). The inflammation is caused by a viral infection. The blood vessels in the conjunctiva become inflamed, causing the eye to become red or pink, and often itchy. Viral conjunctivitis can easily be passed from one person to another (contagious). CAUSES  Viral conjunctivitis is caused by a virus. A virus is a type of contagious germ. It can be spread by touching objects that have been contaminated with the virus, such as doorknobs or towels.  SYMPTOMS  Symptoms of viral conjunctivitis may include:   Eye redness.  Tearing or watery eyes.  Itchy eyes.  Burning feeling in the eyes.  Clear drainage from the eye.  Swollen eyelids.  A gritty feeling in the eye.  Light sensitivity. DIAGNOSIS  Viral conjunctivitis may be diagnosed with a medical history and physical exam. If you have discharge from your eye, the discharge may be tested to rule out other causes of conjunctivitis.  TREATMENT  Viral conjunctivitis does not respond to medicines that kill bacteria (antibiotics). Treatment for viral conjunctivitis is directed at stopping a bacterial infection from developing in addition to the viral infection. Treatment also aims to relieve your symptoms, such as itching. This may be done with antihistamine drops or other eye medicines. HOME CARE INSTRUCTIONS  Take medicines only as directed by your health care provider.  Avoid touching or rubbing your eyes.  Apply a warm, clean washcloth to your eye for 10-20 minutes, 3-4 times per day.  If you wear contact lenses, do not wear them until the inflammation is gone and your health care provider says it is safe to wear them again. Ask your health care provider how to sterilize or replace your contact lenses before using them again. Wear glasses until you can resume wearing  contacts.  Avoid wearing eye makeup until the inflammation is gone. Throw away any old eye cosmetics that may be contaminated.  Change or wash your pillowcase every day.  Do not share towels or washcloths. This may spread the infection.  Wash your hands often with soap and water. Use paper towels to dry your hands.  Gently wipe away any drainage from your eye with a warm, wet washcloth or a cotton ball.  Be very careful to avoid touching the edge of the eyelid with the eye drop bottle or ointment tube when applying medicines to the affected eye. This will stop you from spreading the infection to the other eye or to other people. SEEK MEDICAL CARE IF:   Your symptoms do not improve with treatment.  You have increased pain.  Your vision becomes blurry.  You have a fever.  You have facial pain, redness, or swelling.  You have new symptoms.  Your symptoms get worse.   This information is not intended to replace advice given to you by your health care provider. Make sure you discuss any questions you have with your health care provider.   Document Released: 07/20/2002 Document Revised: 10/21/2005 Document Reviewed: 02/08/2014 Elsevier Interactive Patient Education 2016 Elsevier Inc.  

## 2016-02-26 NOTE — Progress Notes (Signed)
HPI: Spencer Delgado is a 30 y.o. male who presents to [facility] [place]  today for chief complaint of:  Chief Complaint  Patient presents with  . Cough    Acute Illness: . Context:  Kids got diagnosed with pink eye, He is worried about similar symptoms  . Location: R eye  . Quality: itchy, crusty, reported some drainage this morning when he woke up . Assoc signs/symptoms: see ROS, low fever 99 . Duration: 2 days . Modifying factors: has tried the following OTC/Rx medications: kids eye drops in the R eye    Past medical, social and family history reviewed. Current medications and allergies reviewed.     Review of Systems:  Constitutional: no fever/chills  HEENT: no headache, no vision change or hearing change, no sore throat  Cardiovascular: No chest pain, No pressure/palpitations  Respiratory: no cough, no shortness of breath  Gastrointestinal: no nausea, no vomiting, no abdominal pain, no diarrhea  Musculoskeletal: no myalgia/arthralgia  Skin/Integument: no rash   Exam:  BP 133/88   Pulse 89   Temp 98.7 F (37.1 C) (Oral)   Ht 5\' 11"  (1.803 m)   Wt (!) 312 lb (141.5 kg)   BMI 43.52 kg/m   Constitutional: VSS, see above. General Appearance: alert, well-developed, well-nourished, NAD  Eyes: Normal lids and conjunctive, non-icteric sclera, PERRLA, EOMI  Ears, Nose, Mouth, Throat: Normal external inspection ears/nares/mouth/lips/gums, normal TM, MMM; posterior pharynx without erythema, without exudate, nasal mucosa normal  Neck: No masses, trachea midline. normal lymph nodes  Respiratory: Normal respiratory effort. No  wheeze/rhonchi/rales  Cardiovascular: S1/S2 normal, no murmur/rub/gallop auscultated. RRR.    ASSESSMENT/PLAN: Eye appears ok at this point, perhaps due to using child's medication. Would avoid contacts for the next few days and put in fresh ones.   Acute conjunctivitis of right eye, unspecified acute  conjunctivitis type - Plan: ciprofloxacin (CILOXAN) 0.3 % ophthalmic solution    Patient Instructions  Viral Conjunctivitis Viral conjunctivitis is an inflammation of the clear membrane that covers the white part of your eye and the inner surface of your eyelid (conjunctiva). The inflammation is caused by a viral infection. The blood vessels in the conjunctiva become inflamed, causing the eye to become red or pink, and often itchy. Viral conjunctivitis can easily be passed from one person to another (contagious). CAUSES  Viral conjunctivitis is caused by a virus. A virus is a type of contagious germ. It can be spread by touching objects that have been contaminated with the virus, such as doorknobs or towels.  SYMPTOMS  Symptoms of viral conjunctivitis may include:   Eye redness.  Tearing or watery eyes.  Itchy eyes.  Burning feeling in the eyes.  Clear drainage from the eye.  Swollen eyelids.  A gritty feeling in the eye.  Light sensitivity. DIAGNOSIS  Viral conjunctivitis may be diagnosed with a medical history and physical exam. If you have discharge from your eye, the discharge may be tested to rule out other causes of conjunctivitis.  TREATMENT  Viral conjunctivitis does not respond to medicines that kill bacteria (antibiotics). Treatment for viral conjunctivitis is directed at stopping a bacterial infection from developing in addition to the viral infection. Treatment also aims to relieve your symptoms, such as itching. This may be done with antihistamine drops or other eye medicines. HOME CARE INSTRUCTIONS  Take medicines only as directed by your health care provider.  Avoid touching or rubbing your eyes.  Apply a warm, clean washcloth to your eye for  10-20 minutes, 3-4 times per day.  If you wear contact lenses, do not wear them until the inflammation is gone and your health care provider says it is safe to wear them again. Ask your health care provider how to sterilize  or replace your contact lenses before using them again. Wear glasses until you can resume wearing contacts.  Avoid wearing eye makeup until the inflammation is gone. Throw away any old eye cosmetics that may be contaminated.  Change or wash your pillowcase every day.  Do not share towels or washcloths. This may spread the infection.  Wash your hands often with soap and water. Use paper towels to dry your hands.  Gently wipe away any drainage from your eye with a warm, wet washcloth or a cotton ball.  Be very careful to avoid touching the edge of the eyelid with the eye drop bottle or ointment tube when applying medicines to the affected eye. This will stop you from spreading the infection to the other eye or to other people. SEEK MEDICAL CARE IF:   Your symptoms do not improve with treatment.  You have increased pain.  Your vision becomes blurry.  You have a fever.  You have facial pain, redness, or swelling.  You have new symptoms.  Your symptoms get worse.   This information is not intended to replace advice given to you by your health care provider. Make sure you discuss any questions you have with your health care provider.   Document Released: 07/20/2002 Document Revised: 10/21/2005 Document Reviewed: 02/08/2014 Elsevier Interactive Patient Education 2016 [employer].       Visit summary was printed for the patient with medications and pertinent instructions for patient to review. ER/RTC precautions reviewed. All questions answered. Return if symptoms worsen or fail to improve.

## 2016-02-28 LAB — CBC
HCT: 48.6 % (ref 38.5–50.0)
Hemoglobin: 17 g/dL (ref 13.2–17.1)
MCH: 28.9 pg (ref 27.0–33.0)
MCHC: 35 g/dL (ref 32.0–36.0)
MCV: 82.7 fL (ref 80.0–100.0)
MPV: 9.9 fL (ref 7.5–12.5)
PLATELETS: 351 10*3/uL (ref 140–400)
RBC: 5.88 MIL/uL — AB (ref 4.20–5.80)
RDW: 15.4 % — AB (ref 11.0–15.0)
WBC: 6.3 10*3/uL (ref 3.8–10.8)

## 2016-02-29 LAB — COMPLETE METABOLIC PANEL WITH GFR
ALT: 36 U/L (ref 9–46)
AST: 28 U/L (ref 10–40)
Albumin: 4.5 g/dL (ref 3.6–5.1)
Alkaline Phosphatase: 52 U/L (ref 40–115)
BUN: 12 mg/dL (ref 7–25)
CO2: 24 mmol/L (ref 20–31)
Calcium: 9.4 mg/dL (ref 8.6–10.3)
Chloride: 100 mmol/L (ref 98–110)
Creat: 0.93 mg/dL (ref 0.60–1.35)
GLUCOSE: 92 mg/dL (ref 65–99)
POTASSIUM: 4.5 mmol/L (ref 3.5–5.3)
SODIUM: 136 mmol/L (ref 135–146)
Total Bilirubin: 0.6 mg/dL (ref 0.2–1.2)
Total Protein: 7.8 g/dL (ref 6.1–8.1)

## 2016-02-29 LAB — HEMOGLOBIN A1C
Hgb A1c MFr Bld: 5.6 % (ref <5.7)
MEAN PLASMA GLUCOSE: 114 mg/dL

## 2016-02-29 LAB — LIPID PANEL
CHOL/HDL RATIO: 7.5 ratio — AB (ref <=5.0)
CHOLESTEROL: 202 mg/dL — AB (ref 125–200)
HDL: 27 mg/dL — ABNORMAL LOW (ref >=40)
LDL Cholesterol: 147 mg/dL — ABNORMAL HIGH (ref <130)
TRIGLYCERIDES: 138 mg/dL (ref <150)
VLDL: 28 mg/dL (ref <30)

## 2016-02-29 LAB — TESTOSTERONE TOTAL,FREE,BIO, MALES
Albumin: 4.5 g/dL (ref 3.6–5.1)
Sex Hormone Binding: 13 nmol/L (ref 10–50)
TESTOSTERONE BIOAVAILABLE: 467.5 ng/dL (ref 110.0–575.0)
TESTOSTERONE: 798 ng/dL (ref 250–827)
Testosterone, Free: 227.3 pg/mL — ABNORMAL HIGH (ref 46.0–224.0)

## 2016-02-29 LAB — LDL CHOLESTEROL, DIRECT: Direct LDL: 160 mg/dL — ABNORMAL HIGH (ref <130)

## 2016-02-29 LAB — T4, FREE: FREE T4: 1.4 ng/dL (ref 0.8–1.8)

## 2016-02-29 LAB — T3, FREE: T3 FREE: 3.7 pg/mL (ref 2.3–4.2)

## 2016-02-29 LAB — TSH: TSH: 1.6 mIU/L (ref 0.40–4.50)

## 2016-02-29 LAB — VITAMIN D 25 HYDROXY (VIT D DEFICIENCY, FRACTURES): Vit D, 25-Hydroxy: 20 ng/mL — ABNORMAL LOW (ref 30–100)

## 2016-02-29 MED ORDER — LEVOTHYROXINE SODIUM 88 MCG PO TABS: 88.0000 ug | ORAL_TABLET | Freq: Every day | ORAL | 1 refills | Status: DC

## 2016-02-29 MED ORDER — ATORVASTATIN CALCIUM 10 MG PO TABS: 10.0000 mg | ORAL_TABLET | Freq: Every day | ORAL | 0 refills | Status: DC

## 2016-02-29 NOTE — Addendum Note (Signed)
Addended by: Rodolph BongOREY, EVAN S on: 02/29/2016 06:29 AM   Modules accepted: Orders

## 2016-03-01 ENCOUNTER — Telehealth: Payer: Self-pay | Admitting: Family Medicine

## 2016-03-01 NOTE — Telephone Encounter (Signed)
Pt advised of results and recommendations regarding cholesterol Rx. Pt refuses to take Lipitor. State he is on a new diet and wants to bring it down that way.

## 2016-03-05 LAB — CORTISOL, FREE: Cortisol Free, Ser: 0.17 ug/dL

## 2016-04-08 ENCOUNTER — Other Ambulatory Visit: Payer: Self-pay

## 2016-04-08 NOTE — Telephone Encounter (Signed)
Pt called stating that he is transitioning to a new job and will loose his current insurance coverage on 04/11/2016. He will begin receiving coverage with the new employer 90 days after his hire date. Pt is requesting a 90 day refill of levothyroxine and testosterone to bridge him during this time. Pt is currently receiving testosterone in  2 ML vials and was [facility] [address] [facility] [place] that they have 10 ML vials available if prescribed. Please advise on refills as well as the appropriate way to rx the testosterone.

## 2016-04-09 MED ORDER — LEVOTHYROXINE SODIUM 88 MCG PO TABS: 88.0000 ug | ORAL_TABLET | Freq: Every day | ORAL | 0 refills | Status: DC

## 2016-04-09 MED ORDER — TESTOSTERONE CYPIONATE 200 MG/ML IM SOLN: INTRAMUSCULAR | 4 refills | Status: DC

## 2016-04-09 NOTE — Telephone Encounter (Signed)
We'll prescribe both levothyroxine and testosterone.

## 2016-04-10 NOTE — Telephone Encounter (Signed)
Called pt to notify him of rx. Pt advised that his insurance would only cover a 30 day supply (4 1ML vials) of depo testosterone and that he would have a out of pocket cost of more than $100.00. Advised pt that rx may be more affordable at [facility] with the use of a GoodRx savings card. Spoke with pts wife per request of the pt and advised that the rx would be transferred to [facility] [place] along with a GoodRx card. Requested a call back with outcome. [facility] [address] [place]. Rx and card [facility]ed to [facility].

## 2016-04-11 NOTE — Telephone Encounter (Signed)
Pts wife left a vm stating that the pharmacy informed her theat the testosterone would costs about $90 with the goodrx card. Returned call to [phone] and requested a call back to discuss this further.

## 2016-04-12 NOTE — Telephone Encounter (Signed)
Left VM for Pt's wife advising Rx and discount card are available for pick up.

## 2016-07-02 ENCOUNTER — Other Ambulatory Visit: Payer: Self-pay | Admitting: Family Medicine

## 2017-01-23 ENCOUNTER — Other Ambulatory Visit: Payer: Self-pay | Admitting: Family Medicine

## 2017-03-12 ENCOUNTER — Other Ambulatory Visit: Payer: Self-pay | Admitting: Family Medicine

## 2017-04-02 ENCOUNTER — Other Ambulatory Visit: Payer: Self-pay | Admitting: Family Medicine

## 2017-04-07 ENCOUNTER — Telehealth: Payer: Self-pay

## 2017-04-07 NOTE — Telephone Encounter (Signed)
Patient request a refill for Testosterone. Rx was denied and patient is aware that he needs an appointment for this Rx.  Patient stated that he has no health insurance. Last time patient was seen was 02/25/2016. PCP is aware. Pier Laux,CMA

## 2017-05-08 ENCOUNTER — Other Ambulatory Visit: Payer: Self-pay | Admitting: Family Medicine

## 2017-05-09 ENCOUNTER — Telehealth: Payer: Self-pay | Admitting: Family Medicine

## 2017-05-09 DIAGNOSIS — E291 Testicular hypofunction: Secondary | ICD-10-CM

## 2017-05-09 MED ORDER — TESTOSTERONE CYPIONATE 200 MG/ML IM SOLN: INTRAMUSCULAR | 0 refills | Status: DC

## 2017-05-09 NOTE — Telephone Encounter (Signed)
I have sent in a 4 week supply. Labs must be done fasting before the next refill.  Spencer NeedleMichael needs to come in for an office visit at least every 6 months to prescribe testosterone safely.

## 2017-05-09 NOTE — Telephone Encounter (Signed)
Spoke to patient advised him of the information noted below. Patient verbally understands that no more refills will be provided without labs and office visit. Dupree Givler,CMA

## 2017-05-23 ENCOUNTER — Other Ambulatory Visit: Payer: Self-pay | Admitting: Family Medicine

## 2017-07-21 ENCOUNTER — Encounter: Payer: Self-pay | Admitting: Family Medicine

## 2017-07-21 ENCOUNTER — Ambulatory Visit (INDEPENDENT_AMBULATORY_CARE_PROVIDER_SITE_OTHER): Payer: Self-pay | Admitting: Family Medicine

## 2017-07-21 VITALS — BP 152/101 | HR 98 | Ht 71.0 in | Wt 310.0 lb

## 2017-07-21 DIAGNOSIS — I10 Essential (primary) hypertension: Secondary | ICD-10-CM

## 2017-07-21 DIAGNOSIS — E291 Testicular hypofunction: Secondary | ICD-10-CM

## 2017-07-21 DIAGNOSIS — R7989 Other specified abnormal findings of blood chemistry: Secondary | ICD-10-CM

## 2017-07-21 MED ORDER — LEVOTHYROXINE SODIUM 88 MCG PO TABS: 88.0000 ug | ORAL_TABLET | Freq: Every day | ORAL | 0 refills | Status: DC

## 2017-07-21 MED ORDER — TESTOSTERONE CYPIONATE 200 MG/ML IM SOLN: INTRAMUSCULAR | 0 refills | Status: DC

## 2017-07-21 NOTE — Progress Notes (Signed)
Spencer HousekeeperMichael Delgado is a 32 y.o. male with a history of hypothyroidism, hypogonadism, and hypertension who presents to [facility] [place] today for follow up on medication compliance. The patient states that he has had trouble remaining compliant with his testosterone and thyroid medication due to cost barrier. He has subsequently gained 40 lbs of weight since October. Additionally, his blood pressure has gone up since that time. 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 He remains adamant that if he receives his thyroid and testosterone medication his BP will decrease. He states his energy is very low and he has stopped exercising. He also reports low libido.   Spencer NeedleMichael also suffered a work accident that injured his left hand/wrist. He has received treatment and is being followed by orthopedics. He is currently casted and reports no symptoms.     Past Medical History:  Diagnosis Date  . Hypertension    Past Surgical History:  Procedure Laterality Date  . HERNIA REPAIR     Social History   Tobacco Use  . Smoking status: Never Smoker  . Smokeless tobacco: Never Used  Substance Use Topics  . Alcohol use: Yes    Alcohol/week: 0.0 oz   family history includes Heart attack in his unknown relative; Hyperlipidemia in his mother.  ROS as above:  Medications: Current Outpatient Medications  Medication Sig Dispense Refill  . AMBULATORY NON FORMULARY MEDICATION 3 ml syringes Use as directed 4 each 2  . AMBULATORY NON FORMULARY MEDICATION 22g 1 1/2 in needles Use to administer intramuscular injection 4 each 2  . AMBULATORY NON FORMULARY MEDICATION Blunt fill needles 18g 1 1/2 in Use to draw medication in syringe 4 each 2  . atorvastatin (LIPITOR) 10 MG tablet Take 1 tablet (10 mg total) by mouth daily. 90 tablet 0  . levothyroxine (SYNTHROID, LEVOTHROID) 88 MCG  tablet Take 1 tablet (88 mcg total) by mouth daily. 90 tablet 0  . testosterone cypionate (DEPOTESTOSTERONE CYPIONATE) 200 MG/ML injection INJECT 1.75 ML INTO THE MUSCLE EVERY 14 DAYS. 10 mL 0   No current facility-administered medications for this visit.    Allergies  Allergen Reactions  . Morphine And Related Other (See Comments)    Stops heart    Health Maintenance Health Maintenance  Topic Date Due  . HIV Screening  08/23/1998  . INFLUENZA VACCINE  12/11/2016  . TETANUS/TDAP  05/13/2024     Exam:  BP (!) 152/101   Pulse 98   Ht 5\' 11"  (1.803 m)   Wt (!) 310 lb (140.6 kg)   BMI 43.24 kg/m  Gen: Well NAD. Morbidly obese.  HEENT: EOMI,  MMM Lungs: Normal work of breathing. CTABL Heart: RRR no MRG Abd: NABS, Soft. Nondistended, Nontender Exts: Brisk capillary refill, warm and well perfused. Left hand is immobilized in a cast.    No results found for this or any previous visit (from the past 72 hour(s)). No results found.    Assessment and Plan: 32 y.o. male with hypothyroidism, hypogonadism, hypertension, and morbid obesity. He is noncompliant with his medications, which has led to an increase in weight, decreased energy, and decreased quality of life.   Hypothyroidism: Patient will begin taking his thyroid medication and follow up labs in 6 weeks to check TSH.   Hypogonadism: Patient will begin taking depo-testosterone and follow up labs in 6 weeks to check testosterone levels, CBC, CMP.   Morbid Obesity: Patient will continue following the  keto diet, but will begin tracking calories more thoroughly. Starting thyroid and testosterone medications should help with weight loss and motivation to exercise.   Hypertension: Per patient's request we will weight on medical management of hypertension. Weekly blood pressures will be tracked by the patient over the next 6 weeks. If BP falls after proper management of hypogonadism and hypothyroidism, then we will continue to hold  off on medical management.    No orders of the defined types were placed in this encounter.  Meds ordered this encounter  Medications  . levothyroxine (SYNTHROID, LEVOTHROID) 88 MCG tablet    Sig: Take 1 tablet (88 mcg total) by mouth daily.    Dispense:  90 tablet    Refill:  0    Please consider 90 day supplies to promote better adherence  . testosterone cypionate (DEPOTESTOSTERONE CYPIONATE) 200 MG/ML injection    Sig: INJECT 1.75 ML INTO THE MUSCLE EVERY 14 DAYS.    Dispense:  10 mL    Refill:  0     Discussed warning signs or symptoms. Please see discharge instructions. Patient expresses understanding.

## 2017-07-21 NOTE — Patient Instructions (Signed)
Thank you for coming in today. Send me a blood pressure log of your blood pressures weekly for the next 6 weeks.  We will recheck labs in 6 weeks.  If all is well we recheck every 6 months or so especially labs.

## 2017-07-24 ENCOUNTER — Telehealth: Payer: Self-pay | Admitting: Family Medicine

## 2017-07-30 NOTE — Telephone Encounter (Signed)
Note opened in error.

## 2017-08-12 DIAGNOSIS — G5602 Carpal tunnel syndrome, left upper limb: Secondary | ICD-10-CM | POA: Insufficient documentation

## 2017-09-29 ENCOUNTER — Other Ambulatory Visit: Payer: Self-pay | Admitting: Family Medicine

## 2017-09-29 DIAGNOSIS — E291 Testicular hypofunction: Secondary | ICD-10-CM

## 2017-09-30 ENCOUNTER — Telehealth: Payer: Self-pay | Admitting: Family Medicine

## 2017-09-30 DIAGNOSIS — I10 Essential (primary) hypertension: Secondary | ICD-10-CM

## 2017-09-30 DIAGNOSIS — E291 Testicular hypofunction: Secondary | ICD-10-CM

## 2017-09-30 DIAGNOSIS — R7989 Other specified abnormal findings of blood chemistry: Secondary | ICD-10-CM

## 2017-09-30 NOTE — Telephone Encounter (Signed)
Testosterone refilled for 1 month.  We need labs every 6 months with this medicine.  The last time you had labs was in 2017. We ordered them Dec of 2018.  Please get labs fasting in the near future.

## 2017-09-30 NOTE — Telephone Encounter (Signed)
Patient has been advised that Testosterone has been sent to pharmacy and he verbally understands that labs need to be done before his next refill. Kynlea Blackston,CMA

## 2017-10-01 ENCOUNTER — Other Ambulatory Visit: Payer: Self-pay

## 2017-10-01 ENCOUNTER — Telehealth: Payer: Self-pay | Admitting: Family Medicine

## 2017-10-01 DIAGNOSIS — E291 Testicular hypofunction: Secondary | ICD-10-CM

## 2017-10-01 NOTE — Telephone Encounter (Signed)
Patient called requested that his Testosterone go to [facility] because it was cheaper for a 30 day supply. I called [facility] and cancelled the order for the Testosterone that was sent there. Please advise. Saniyya Gau,CMA

## 2017-10-01 NOTE — Telephone Encounter (Signed)
Patient has been informed. Cree Napoli,CMA  

## 2017-10-01 NOTE — Telephone Encounter (Signed)
Patient notes that labs are expensive.  Testosterone requires monitoring every 6 months.  The levothyroxine also requires monitoring every 6-12 months.  I cannot continue to prescribe these medicines without knowing the lab values.  Cancel the lipid panel as we can avoid that if needed.

## 2017-10-02 MED ORDER — TESTOSTERONE CYPIONATE 200 MG/ML IM SOLN: INTRAMUSCULAR | 0 refills | Status: DC

## 2017-10-09 ENCOUNTER — Telehealth: Payer: Self-pay | Admitting: Family Medicine

## 2017-10-09 DIAGNOSIS — E291 Testicular hypofunction: Secondary | ICD-10-CM

## 2017-10-09 LAB — CBC
HEMATOCRIT: 51.5 % — AB (ref 38.5–50.0)
HEMOGLOBIN: 17.5 g/dL — AB (ref 13.2–17.1)
MCH: 28.8 pg (ref 27.0–33.0)
MCHC: 34 g/dL (ref 32.0–36.0)
MCV: 84.7 fL (ref 80.0–100.0)
MPV: 10 fL (ref 7.5–12.5)
Platelets: 304 10*3/uL (ref 140–400)
RBC: 6.08 10*6/uL — AB (ref 4.20–5.80)
RDW: 13.6 % (ref 11.0–15.0)
WBC: 6.3 10*3/uL (ref 3.8–10.8)

## 2017-10-09 LAB — COMPLETE METABOLIC PANEL WITH GFR
AG RATIO: 1.4 (calc) (ref 1.0–2.5)
ALBUMIN MSPROF: 4.6 g/dL (ref 3.6–5.1)
ALKALINE PHOSPHATASE (APISO): 53 U/L (ref 40–115)
ALT: 41 U/L (ref 9–46)
AST: 31 U/L (ref 10–40)
BILIRUBIN TOTAL: 0.7 mg/dL (ref 0.2–1.2)
BUN: 12 mg/dL (ref 7–25)
CHLORIDE: 101 mmol/L (ref 98–110)
CO2: 26 mmol/L (ref 20–32)
Calcium: 9.5 mg/dL (ref 8.6–10.3)
Creat: 1.01 mg/dL (ref 0.60–1.35)
GFR, EST AFRICAN AMERICAN: 112 mL/min/{1.73_m2} (ref >=60)
GFR, Est Non African American: 97 mL/min/{1.73_m2} (ref >=60)
GLOBULIN: 3.2 g/dL (ref 1.9–3.7)
GLUCOSE: 90 mg/dL (ref 65–99)
POTASSIUM: 3.9 mmol/L (ref 3.5–5.3)
SODIUM: 137 mmol/L (ref 135–146)
TOTAL PROTEIN: 7.8 g/dL (ref 6.1–8.1)

## 2017-10-09 LAB — TSH: TSH: 2.16 m[IU]/L (ref 0.40–4.50)

## 2017-10-09 LAB — LIPID PANEL W/REFLEX DIRECT LDL
CHOLESTEROL: 224 mg/dL — AB (ref <200)
HDL: 35 mg/dL — ABNORMAL LOW (ref >40)
LDL CHOLESTEROL (CALC): 138 mg/dL — AB
Non-HDL Cholesterol (Calc): 189 mg/dL (calc) — ABNORMAL HIGH (ref <130)
Total CHOL/HDL Ratio: 6.4 (calc) — ABNORMAL HIGH (ref <5.0)
Triglycerides: 360 mg/dL — ABNORMAL HIGH (ref <150)

## 2017-10-09 LAB — PSA: PSA: 1.5 ng/mL (ref <=4.0)

## 2017-10-09 LAB — TESTOSTERONE: TESTOSTERONE: 1756 ng/dL — AB (ref 250–827)

## 2017-10-09 MED ORDER — LEVOTHYROXINE SODIUM 88 MCG PO TABS: 88.0000 ug | ORAL_TABLET | Freq: Every day | ORAL | 1 refills | Status: DC

## 2017-10-09 MED ORDER — TESTOSTERONE CYPIONATE 200 MG/ML IM SOLN: 200.0000 mg | INTRAMUSCULAR | 0 refills | Status: DC

## 2017-10-09 NOTE — Telephone Encounter (Signed)
Called pt regarding tests results.  Plan to decrease testosterone to  (1ml) every 2 weeks. New Rx sent in.  Continue levothyroixine dose. Rx sent in.   Pt notes fatigue and is using CPAP. Pt will send me setting and we may adjust the settings.

## 2017-10-21 ENCOUNTER — Telehealth: Payer: Self-pay | Admitting: Family Medicine

## 2017-10-21 DIAGNOSIS — E291 Testicular hypofunction: Secondary | ICD-10-CM

## 2017-10-21 MED ORDER — TESTOSTERONE CYPIONATE 200 MG/ML IM SOLN: 200.0000 mg | INTRAMUSCULAR | 0 refills | Status: DC

## 2017-10-21 MED ORDER — LEVOTHYROXINE SODIUM 88 MCG PO TABS: 88.0000 ug | ORAL_TABLET | Freq: Every day | ORAL | 1 refills | Status: DC

## 2017-10-21 NOTE — Telephone Encounter (Signed)
Reviewed labs Changed location of testosterone

## 2017-10-31 ENCOUNTER — Telehealth: Payer: Self-pay

## 2017-10-31 DIAGNOSIS — E291 Testicular hypofunction: Secondary | ICD-10-CM

## 2017-10-31 MED ORDER — TESTOSTERONE CYPIONATE 200 MG/ML IM SOLN: 200.0000 mg | INTRAMUSCULAR | 0 refills | Status: DC

## 2017-10-31 NOTE — Telephone Encounter (Signed)
Thank you!  Left msg on number pt provided that RX was taken care of [phone]).  Call back info provided

## 2017-10-31 NOTE — Telephone Encounter (Signed)
Medication sent.

## 2017-10-31 NOTE — Telephone Encounter (Signed)
Pt called- wanting RX for [facility] because it is cheaper.  Can not be transferred due to being controlled.  RX pended, [facility] send to [facility] [place]. I will call and cancel previous RX.

## 2018-02-13 ENCOUNTER — Telehealth: Payer: Self-pay

## 2018-02-13 DIAGNOSIS — E291 Testicular hypofunction: Secondary | ICD-10-CM

## 2018-02-13 MED ORDER — TESTOSTERONE CYPIONATE 200 MG/ML IM SOLN: 200.0000 mg | INTRAMUSCULAR | 0 refills | Status: DC

## 2018-02-13 NOTE — Telephone Encounter (Signed)
Patient called requested a refill for Testosterone. Patient last labs are below. Last refill was 10/31/2017.patient is due for a follow up appointment. Please advise  Spencer Delgado    Contains abnormal data Testosterone  Order: [id number]  Status:  Final result  Visible to patient:  Yes (MyChart)  Next appt:  None  Dx:  Hypogonadism in male; Morbid obesity ...   Ref Range & Units 71mo ago  Testosterone 250 - 827 ng/dL [id number]    Comment: Verified by repeat analysis.  Marland Kitchen   Resulting Agency  [facility]      Specimen Collected: 10/08/17 11:30 Last Resulted: 10/09/17 03:10

## 2018-02-13 NOTE — Telephone Encounter (Signed)
[facility]

## 2018-02-13 NOTE — Telephone Encounter (Addendum)
Patient has been advised and verbally understands that he needs to get labs done for further refills. Rhonda Cunningham,CMA

## 2018-05-14 DIAGNOSIS — M79602 Pain in left arm: Secondary | ICD-10-CM | POA: Insufficient documentation

## 2018-06-16 ENCOUNTER — Telehealth: Payer: Self-pay

## 2018-06-16 MED ORDER — LISINOPRIL 20 MG PO TABS: 20.0000 mg | ORAL_TABLET | Freq: Every day | ORAL | 1 refills | Status: DC

## 2018-06-16 NOTE — Telephone Encounter (Signed)
Patient called stated that during his last visit his blood pressure was high and during his visit there was mention of putting him on blood pressure medication. Patient stated that since his visit his blood pressure is still elevated and he is now requesting to go on medication. Almyra Birman,CMA

## 2018-06-16 NOTE — Telephone Encounter (Signed)
Advised patient that medication has been sent to pharmacy. Nolita Kutter,CMA

## 2018-06-16 NOTE — Telephone Encounter (Signed)
Lisinopril sent to [facility] pharmacy in [place]. Schedule follow-up appointment in about a month.

## 2018-06-22 MED ORDER — SODIUM CHLORIDE 0.9 % IV SOLN
10.00 | INTRAVENOUS | Status: DC
Start: ? — End: 2018-06-22

## 2018-06-22 MED ORDER — GENERIC EXTERNAL MEDICATION
10.00 | Status: DC
Start: ? — End: 2018-06-22

## 2018-07-17 ENCOUNTER — Other Ambulatory Visit: Payer: Self-pay

## 2018-07-17 ENCOUNTER — Telehealth: Payer: Self-pay

## 2018-07-17 DIAGNOSIS — I10 Essential (primary) hypertension: Secondary | ICD-10-CM

## 2018-07-17 DIAGNOSIS — E291 Testicular hypofunction: Secondary | ICD-10-CM

## 2018-07-17 NOTE — Telephone Encounter (Signed)
Spencer Delgado called to have his lab work ordered for a testosterone level check so he can get a refill of testosterone. After review of the chart I noticed he has not been seen since 07/21/2017. I advised him he needs to come in for an appointment. He states she can't afford an office visit because he is self pay. He just wants to have his testosterone checked. I asked him when was his last injection. He reports two weeks ago. I told him the test would not give [place] the information we need due to the fact he has been out of the testosterone. He states he has called multiple times and left multiple messages asking for refills or an order for labs. He will soon be out of his blood pressure medication and thyroid medication. Please advise.

## 2018-07-20 MED ORDER — TESTOSTERONE CYPIONATE 200 MG/ML IM SOLN: 200.0000 mg | INTRAMUSCULAR | 0 refills | Status: DC

## 2018-07-20 NOTE — Telephone Encounter (Signed)
Left VM with status update.  

## 2018-07-20 NOTE — Telephone Encounter (Signed)
I went ahead and ordered labs and refill medication.  Patient should have labs done fasting between cycles.

## 2018-07-29 ENCOUNTER — Other Ambulatory Visit: Payer: Self-pay | Admitting: Family Medicine

## 2018-07-29 NOTE — Telephone Encounter (Signed)
Needs appointment

## 2018-10-02 DIAGNOSIS — K4091 Unilateral inguinal hernia, without obstruction or gangrene, recurrent: Secondary | ICD-10-CM | POA: Insufficient documentation

## 2019-04-28 DIAGNOSIS — K912 Postsurgical malabsorption, not elsewhere classified: Secondary | ICD-10-CM | POA: Insufficient documentation

## 2019-04-28 DIAGNOSIS — Z903 Acquired absence of stomach [part of]: Secondary | ICD-10-CM | POA: Insufficient documentation

## 2020-03-31 ENCOUNTER — Encounter: Payer: Self-pay | Admitting: Family Medicine

## 2020-03-31 ENCOUNTER — Other Ambulatory Visit: Payer: Self-pay

## 2020-03-31 ENCOUNTER — Ambulatory Visit (INDEPENDENT_AMBULATORY_CARE_PROVIDER_SITE_OTHER): Payer: Medicaid Other

## 2020-03-31 ENCOUNTER — Ambulatory Visit (INDEPENDENT_AMBULATORY_CARE_PROVIDER_SITE_OTHER): Payer: Medicaid Other | Admitting: Family Medicine

## 2020-03-31 VITALS — BP 141/88 | HR 71 | Temp 98.4°F | Wt 260.2 lb

## 2020-03-31 DIAGNOSIS — M25511 Pain in right shoulder: Secondary | ICD-10-CM

## 2020-03-31 DIAGNOSIS — Y9344 Activity, trampolining: Secondary | ICD-10-CM | POA: Diagnosis not present

## 2020-03-31 DIAGNOSIS — M7581 Other shoulder lesions, right shoulder: Secondary | ICD-10-CM | POA: Diagnosis not present

## 2020-03-31 MED ORDER — PREDNISONE 10 MG (21) PO TBPK: ORAL_TABLET | ORAL | 0 refills | Status: DC

## 2020-03-31 NOTE — Patient Instructions (Signed)
Nice to meet your today! Please try steroid taper and home exercises.  Let me know if symptoms aren't improving after 2-3 weeks.

## 2020-03-31 NOTE — Assessment & Plan Note (Signed)
History and exam consistent with tendinitis of the rotator cuff.  Given handout for shoulder rehab Start prednisone taper.  Unable to utilize NSAIDS due to history of weight loss surgery.  Follow up in 3-4 weeks if not improving.

## 2020-03-31 NOTE — Progress Notes (Signed)
Spencer Delgado - 34 y.o. male MRN [id number]  Date of birth: 1985/05/28  Subjective Chief Complaint  Patient presents with  . Shoulder Pain    HPI Spencer Delgado is a 34 y.o. male here today with complaint of R shoulder pain.  He is R handed.  He reports that he was at a trampoline park a couple of weeks ago and fell onto his shoulder.  Since that time he has had pain along the upper, lateral shoulder.  He does notice a popping sensation at times. This does radiate some into the arm.  It is worse with raising arm, bench press exercises and throwing actions.    ROS:  A comprehensive ROS was completed and negative except as noted per HPI  Allergies  Allergen Reactions  . Morphine And Related Other (See Comments)    Stops heart  . Nsaids Other (See Comments)    Due to bariatric surgery    Past Medical History:  Diagnosis Date  . Hypertension     Past Surgical History:  Procedure Laterality Date  . HERNIA REPAIR      Social History   Socioeconomic History  . Marital status: Married    Spouse name: Not on file  . Number of children: Not on file  . Years of education: Not on file  . Highest education level: Not on file  Occupational History  . Not on file  Tobacco Use  . Smoking status: Never Smoker  . Smokeless tobacco: Never Used  Substance and Sexual Activity  . Alcohol use: Yes    Alcohol/week: 0.0 standard drinks  . Drug use: No  . Sexual activity: Yes    Partners: Female  Other Topics Concern  . Not on file  Social History Narrative  . Not on file   Social Determinants of Health   Financial Resource Strain:   . Difficulty of Paying Living Expenses: Not on file  Food Insecurity:   . Worried About Programme researcher, broadcasting/film/video in the Last Year: Not on file  . Ran Out of Food in the Last Year: Not on file  Transportation Needs:   . Lack of Transportation (Medical): Not on file  . Lack of Transportation (Non-Medical): Not on file  Physical Activity:   . Days  of Exercise per Week: Not on file  . Minutes of Exercise per Session: Not on file  Stress:   . Feeling of Stress : Not on file  Social Connections:   . Frequency of Communication with Friends and Family: Not on file  . Frequency of Social Gatherings with Friends and Family: Not on file  . Attends Religious Services: Not on file  . Active Member of Clubs or Organizations: Not on file  . Attends Banker Meetings: Not on file  . Marital Status: Not on file    Family History  Problem Relation Age of Onset  . Heart attack Unknown        grandfather   . Hyperlipidemia Mother     Health Maintenance  Topic Date Due  . Hepatitis C Screening  Never done  . COVID-19 Vaccine (1) Never done  . HIV Screening  Never done  . INFLUENZA VACCINE  12/12/2019  . TETANUS/TDAP  05/13/2024     ----------------------------------------------------------------------------------------------------------------------------------------------------------------------------------------------------------------- Physical Exam BP (!) 141/88 (BP Location: Left Arm, Patient Position: Sitting, Cuff Size: Large)   Pulse 71   Temp 98.4 F (36.9 C)   Wt 260 lb 3.2 oz (118 kg)   BMI  36.29 kg/m   Physical Exam Constitutional:      Appearance: Normal appearance.  Musculoskeletal:     Cervical back: Neck supple.     Comments: R shoulder with tenderness over lateral humeral head.  Non-tender along AC joint and bicipital groove.    ROM with mild pain on abduction.  Passive ROM without pain.   Rotator cuff strength is normal.  No impingement signs.   Negative empty can test.   Neurological:     General: No focal deficit present.     Mental Status: He is alert.  Psychiatric:        Mood and Affect: Mood normal.        Behavior: Behavior normal.      ------------------------------------------------------------------------------------------------------------------------------------------------------------------------------------------------------------------- Assessment and Plan  Rotator cuff tendonitis, right History and exam consistent with tendinitis of the rotator cuff.  Given handout for shoulder rehab Start prednisone taper.  Unable to utilize NSAIDS due to history of weight loss surgery.  Follow up in 3-4 weeks if not improving.     Meds ordered this encounter  Medications  . predniSONE (STERAPRED UNI-PAK 21 TAB) 10 MG (21) TBPK tablet    Sig: Taper as directed on packaging.    Dispense:  21 tablet    Refill:  0    No follow-ups on file.    This visit occurred during the SARS-CoV-2 public health emergency.  Safety protocols were in place, including screening questions prior to the visit, additional usage of staff PPE, and extensive cleaning of exam room while observing appropriate contact time as indicated for disinfecting solutions.

## 2020-04-14 ENCOUNTER — Encounter: Payer: Self-pay | Admitting: Family Medicine

## 2020-04-14 ENCOUNTER — Ambulatory Visit (INDEPENDENT_AMBULATORY_CARE_PROVIDER_SITE_OTHER): Payer: Medicaid Other | Admitting: Family Medicine

## 2020-04-14 ENCOUNTER — Other Ambulatory Visit: Payer: Self-pay

## 2020-04-14 VITALS — BP 134/98 | HR 88 | Temp 97.7°F | Wt 258.0 lb

## 2020-04-14 DIAGNOSIS — R5383 Other fatigue: Secondary | ICD-10-CM

## 2020-04-14 DIAGNOSIS — E291 Testicular hypofunction: Secondary | ICD-10-CM

## 2020-04-14 DIAGNOSIS — K912 Postsurgical malabsorption, not elsewhere classified: Secondary | ICD-10-CM | POA: Diagnosis not present

## 2020-04-14 DIAGNOSIS — R7989 Other specified abnormal findings of blood chemistry: Secondary | ICD-10-CM | POA: Diagnosis not present

## 2020-04-14 DIAGNOSIS — Z903 Acquired absence of stomach [part of]: Secondary | ICD-10-CM

## 2020-04-14 DIAGNOSIS — M25511 Pain in right shoulder: Secondary | ICD-10-CM

## 2020-04-14 MED ORDER — TESTOSTERONE CYPIONATE 200 MG/ML IM SOLN: INTRAMUSCULAR | 0 refills | Status: DC

## 2020-04-14 MED ORDER — "SYRINGE/NEEDLE (DISP) 22G X 1-1/2"" 3 ML MISC": 0 refills | Status: AC

## 2020-04-14 MED ORDER — "BD BLUNT FILL NEEDLE 18G X 1-1/2"" MISC": 0 refills | Status: AC

## 2020-04-14 NOTE — Assessment & Plan Note (Signed)
Some improvement with steroid.  His difficulty with throwing and feeling of diminished strength may be related to labral tear.  I am going to have him see Dr. Benjamin Stain for evaluation of shoulder.  Discussed he may need MRI arthrogram to fully evaluate for labral tear.

## 2020-04-14 NOTE — Patient Instructions (Signed)
Let me know how you are feeling after about 6 weeks.  Follow up with me here in 10-12 weeks.  Schedule with Dr. Karie Schwalbe for the shoulder.

## 2020-04-14 NOTE — Assessment & Plan Note (Signed)
He felt much better with depo-testosterone preparation however this wasn't lasting a full 2 weeks. He would like for me to manage this for him and I will start testosterone cypionate at 200mg  every 10 days.  We'll recheck levels in about 3 months.  We'll also obtain psa, cbc and estradiol levels at this visit.

## 2020-04-14 NOTE — Progress Notes (Signed)
Spencer Delgado - 34 y.o. male MRN [id number]  Date of birth: 04-05-1986  Subjective Chief Complaint  Patient presents with  . Shoulder Pain    HPI Spencer Delgado is a 34 y.o. male here today for follow up of R shoulder pain.  He would also like to discuss his testosterone supplementation.    He reports that R shoulder pain initially improved with steroid, however after completion of this pain returned.  It is not as bad as it was initially but affects his ability to throw a ball.  Feels like there is a loss of strength when doing this.  He also has some pain at times with certain overhead movements.  Previous xrays negative.  ROM is pretty good.   He is currently seeing endocrinology for testosterone supplementation.  Current treatment is with androgel.  He was previously depo-testosterone injection however he was having symptoms of low T prior to his next injection.  He was changed to topical in attempt to keep levels more consistent.  His last level was 312.  He is still symptomatic at this level and would like to change back to depo-testosterone.  He reports significantly decreased libido, fatigue and decreased overall strength.    ROS:  A comprehensive ROS was completed and negative except as noted per HPI  Allergies  Allergen Reactions  . Morphine And Related Other (See Comments)    Stops heart  . Nsaids Other (See Comments)    Due to bariatric surgery    Past Medical History:  Diagnosis Date  . Hypertension     Past Surgical History:  Procedure Laterality Date  . HERNIA REPAIR      Social History   Socioeconomic History  . Marital status: Married    Spouse name: Not on file  . Number of children: Not on file  . Years of education: Not on file  . Highest education level: Not on file  Occupational History  . Not on file  Tobacco Use  . Smoking status: Never Smoker  . Smokeless tobacco: Never Used  Substance and Sexual Activity  . Alcohol use: Yes     Alcohol/week: 0.0 standard drinks  . Drug use: No  . Sexual activity: Yes    Partners: Female  Other Topics Concern  . Not on file  Social History Narrative  . Not on file   Social Determinants of Health   Financial Resource Strain:   . Difficulty of Paying Living Expenses: Not on file  Food Insecurity:   . Worried About Programme researcher, broadcasting/film/video in the Last Year: Not on file  . Ran Out of Food in the Last Year: Not on file  Transportation Needs:   . Lack of Transportation (Medical): Not on file  . Lack of Transportation (Non-Medical): Not on file  Physical Activity:   . Days of Exercise per Week: Not on file  . Minutes of Exercise per Session: Not on file  Stress:   . Feeling of Stress : Not on file  Social Connections:   . Frequency of Communication with Friends and Family: Not on file  . Frequency of Social Gatherings with Friends and Family: Not on file  . Attends Religious Services: Not on file  . Active Member of Clubs or Organizations: Not on file  . Attends Banker Meetings: Not on file  . Marital Status: Not on file    Family History  Problem Relation Age of Onset  . Heart attack Unknown  grandfather   . Hyperlipidemia Mother     Health Maintenance  Topic Date Due  . Hepatitis C Screening  Never done  . COVID-19 Vaccine (1) Never done  . HIV Screening  Never done  . INFLUENZA VACCINE  12/12/2019  . TETANUS/TDAP  05/13/2024     ----------------------------------------------------------------------------------------------------------------------------------------------------------------------------------------------------------------- Physical Exam BP (!) 134/98 (BP Location: Left Arm, Patient Position: Sitting, Cuff Size: Large)   Pulse 88   Temp 97.7 F (36.5 C)   Wt 258 lb (117 kg)   SpO2 97%   BMI 35.98 kg/m   Physical Exam Constitutional:      Appearance: Normal appearance.  HENT:     Head: Normocephalic and atraumatic.  Eyes:      General: No scleral icterus. Cardiovascular:     Rate and Rhythm: Normal rate and regular rhythm.  Pulmonary:     Effort: Pulmonary effort is normal.     Breath sounds: Normal breath sounds.  Musculoskeletal:     Cervical back: Neck supple.  Neurological:     General: No focal deficit present.     Mental Status: He is alert.  Psychiatric:        Mood and Affect: Mood normal.        Behavior: Behavior normal.     ------------------------------------------------------------------------------------------------------------------------------------------------------------------------------------------------------------------- Assessment and Plan  Hypogonadism in male He felt much better with depo-testosterone preparation however this wasn't lasting a full 2 weeks. He would like for me to manage this for him and I will start testosterone cypionate at 200mg  every 10 days.  We'll recheck levels in about 3 months.  We'll also obtain psa, cbc and estradiol levels at this visit.    Right shoulder pain Some improvement with steroid.  His difficulty with throwing and feeling of diminished strength may be related to labral tear.  I am going to have him see Dr. for evaluation of shoulder.  Discussed he may need MRI arthrogram to fully evaluate for labral tear.    Meds ordered this encounter  Medications  . testosterone cypionate (DEPOTESTOSTERONE CYPIONATE) 200 MG/ML injection    Sig: Inject 24mL IM every 10 days.    Dispense:  10 mL    Refill:  0    To replace androgel  . NEEDLE, DISP, 18 G (B-D BLUNT FILL NEEDLE) 18G X 1-1/2" MISC    Sig: Use to draw up testosterone    Dispense:  100 each    Refill:  0  . SYRINGE-NEEDLE, DISP, 3 ML 22G X 1-1/2" 3 ML MISC    Sig: Use to inject testosterone as directed    Dispense:  50 each    Refill:  0    No follow-ups on file.    This visit occurred during the SARS-CoV-2 public health emergency.  Safety protocols were in place,  including screening questions prior to the visit, additional usage of staff PPE, and extensive cleaning of exam room while observing appropriate contact time as indicated for disinfecting solutions.

## 2020-04-24 ENCOUNTER — Ambulatory Visit (INDEPENDENT_AMBULATORY_CARE_PROVIDER_SITE_OTHER): Payer: Medicaid Other | Admitting: Sports Medicine

## 2020-04-24 ENCOUNTER — Other Ambulatory Visit: Payer: Self-pay

## 2020-04-24 ENCOUNTER — Ambulatory Visit (INDEPENDENT_AMBULATORY_CARE_PROVIDER_SITE_OTHER): Payer: Medicaid Other

## 2020-04-24 DIAGNOSIS — M25511 Pain in right shoulder: Secondary | ICD-10-CM

## 2020-04-24 DIAGNOSIS — E291 Testicular hypofunction: Secondary | ICD-10-CM | POA: Diagnosis not present

## 2020-04-24 DIAGNOSIS — S46011A Strain of muscle(s) and tendon(s) of the rotator cuff of right shoulder, initial encounter: Secondary | ICD-10-CM

## 2020-04-24 MED ORDER — CELECOXIB 200 MG PO CAPS: ORAL_CAPSULE | ORAL | 2 refills | Status: DC

## 2020-04-24 NOTE — Progress Notes (Signed)
° ° °  Procedures performed today:    None.  Independent interpretation of notes and tests performed by another provider:   X-rays personally reviewed, no obvious fractures.  Brief History, Exam, Impression, and Recommendations:    Right shoulder pain This is a pleasant 34 year old male, 1 month ago he was at the trampoline park and fell directly onto his right shoulder, since he has had pain that he localizes over the acromioclavicular joint, on exam he has several rotator cuff, proximal biceps and labral signs. Due to weakness to abduction I am concerned that he is torn his supraspinatus as well as a positive speeds test, Yergason test concerning for a long head of the biceps tear. Due to weakness we are going to proceed with MRI today. Further management will depend on MRI results, he is post gastric sleeve, switching from ibuprofen to Celebrex, NSAIDs were okay in sleeve gastrectomy, we should just use them with caution, they are contraindicated in gastric bypass.   Hypogonadism in male Currently doing testosterone 200 mg q. 10 days. Further management per Dr. 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 He is post gastric sleeve and has lost a great deal of weight, he was congratulated, his goal is to obtain a specific physique. My advice to him was to do the traditional bulking followed by cutting of fat, he does desire to cut first and then bulk which will make it very difficult for him to do resistance training in such a calorie negative state. He will give this a try for now.    ___________________________________________ Spencer Delgado. Benjamin Stain, M.D., ABFM., CAQSM. Primary Care and Sports Medicine  [facility]  Adjunct Instructor of Family Medicine  [employer]

## 2020-04-24 NOTE — Assessment & Plan Note (Addendum)
This is a pleasant 35 year old male, 1 month ago he was at the trampoline park and fell directly onto his right shoulder, since he has had pain that he localizes over the acromioclavicular joint, on exam he has several rotator cuff, proximal biceps and labral signs. Due to weakness to abduction I am concerned that he is torn his supraspinatus as well as a positive speeds test, Yergason test concerning for a long head of the biceps tear. Due to weakness we are going to proceed with MRI today. Further management will depend on MRI results, he is post gastric sleeve, switching from ibuprofen to Celebrex, NSAIDs were okay in sleeve gastrectomy, we should just use them with caution, they are contraindicated in gastric bypass.

## 2020-04-24 NOTE — Assessment & Plan Note (Signed)
Currently doing testosterone 200 mg q. 10 days. Further management per Dr. 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 He is post gastric sleeve and has lost a great deal of weight, he was congratulated, his goal is to obtain a specific physique. My advice to him was to do the traditional bulking followed by cutting of fat, he does desire to cut first and then bulk which will make it very difficult for him to do resistance training in such a calorie negative state. He will give this a try for now.

## 2020-04-25 DIAGNOSIS — M25511 Pain in right shoulder: Secondary | ICD-10-CM

## 2020-04-26 NOTE — Telephone Encounter (Signed)
LVM for patient to call back to get appt scheduled. AM 

## 2020-04-26 NOTE — Telephone Encounter (Signed)
Please get him set up for an appointment for injection.

## 2020-05-01 ENCOUNTER — Other Ambulatory Visit: Payer: Self-pay | Admitting: Family Medicine

## 2020-05-01 ENCOUNTER — Encounter: Payer: Self-pay | Admitting: Family Medicine

## 2020-05-01 DIAGNOSIS — Z3009 Encounter for other general counseling and advice on contraception: Secondary | ICD-10-CM

## 2020-05-02 ENCOUNTER — Ambulatory Visit (INDEPENDENT_AMBULATORY_CARE_PROVIDER_SITE_OTHER): Payer: Medicaid Other | Admitting: Sports Medicine

## 2020-05-02 ENCOUNTER — Ambulatory Visit (INDEPENDENT_AMBULATORY_CARE_PROVIDER_SITE_OTHER): Payer: Medicaid Other

## 2020-05-02 ENCOUNTER — Other Ambulatory Visit: Payer: Self-pay

## 2020-05-02 DIAGNOSIS — M25511 Pain in right shoulder: Secondary | ICD-10-CM

## 2020-05-02 NOTE — Assessment & Plan Note (Signed)
Spencer Delgado is a pleasant 34 year old male, he fell directly into his right shoulder at a trampoline park, ultimately we obtained an MRI that showed interstitial tearing of the subscapularis, labral tearing, he has severe pain, we did a subacromial injection today, he will do physical therapy elsewhere in [place], I would also like a second opinion from Dr. Everardo Pacific, he does desire surgical opinion though I think it is a bit early. Return to see me in 6 weeks. His ultimate goal is to get into [employer] [facility] [employer].

## 2020-05-02 NOTE — Progress Notes (Signed)
    Procedures performed today:    Procedure: Real-time Ultrasound Guided injection of the right subacromial bursa Device: Samsung HS60  Verbal informed consent obtained.  Time-out conducted.  Noted no overlying erythema, induration, or other signs of local infection.  Skin prepped in a sterile fashion.  Local anesthesia: Topical Ethyl chloride.  With sterile technique and under real time ultrasound guidance: 1 cc Kenalog 40, 2 cc lidocaine, 2 cc bupivacaine injected easily Completed without difficulty  Advised to call if fevers/chills, erythema, induration, drainage, or persistent bleeding.  Images permanently stored and available for review in PACS.  Impression: Technically successful ultrasound guided injection.  Independent interpretation of notes and tests performed by another provider:   None.  Brief History, Exam, Impression, and Recommendations:    Right shoulder pain Spencer Delgado is a pleasant 34 year old male, he fell directly into his right shoulder at a trampoline park, ultimately we obtained an MRI that showed interstitial tearing of the subscapularis, labral tearing, he has severe pain, we did a subacromial injection today, he will do physical therapy elsewhere in [place], I would also like a second opinion from Dr. Everardo Pacific, he does desire surgical opinion though I think it is a bit early. Return to see me in 6 weeks. His ultimate goal is to get into [employer] [facility] [employer].    ___________________________________________ Spencer Delgado. Benjamin Stain, M.D., ABFM., CAQSM. Primary Care and Sports Medicine [facility] [facility]  Adjunct Instructor of Family Medicine  [employer]

## 2020-05-16 ENCOUNTER — Ambulatory Visit: Payer: Medicaid Other | Attending: Sports Medicine | Admitting: Physical Therapy

## 2020-05-30 ENCOUNTER — Ambulatory Visit: Payer: Medicaid Other | Admitting: Sports Medicine

## 2020-06-16 ENCOUNTER — Encounter: Payer: Self-pay | Admitting: Family Medicine

## 2020-06-19 ENCOUNTER — Encounter: Payer: Self-pay | Admitting: Family Medicine

## 2020-07-13 ENCOUNTER — Encounter: Payer: Self-pay | Admitting: Family Medicine

## 2020-07-13 ENCOUNTER — Ambulatory Visit (INDEPENDENT_AMBULATORY_CARE_PROVIDER_SITE_OTHER): Payer: Medicaid Other | Admitting: Family Medicine

## 2020-07-13 ENCOUNTER — Other Ambulatory Visit: Payer: Self-pay

## 2020-07-13 VITALS — BP 145/83 | HR 92 | Temp 98.2°F | Wt 257.0 lb

## 2020-07-13 DIAGNOSIS — E291 Testicular hypofunction: Secondary | ICD-10-CM

## 2020-07-13 DIAGNOSIS — M25511 Pain in right shoulder: Secondary | ICD-10-CM

## 2020-07-13 NOTE — Progress Notes (Signed)
Spencer Delgado - 35 y.o. male MRN [id number]  Date of birth: [birth year]  Subjective Chief Complaint  Patient presents with  . Hypogonadism    HPI Spencer Delgado is a 35 y.o. male here today for follow up of low testosterone.  Reports that he is feeling good with current dosing of testosterone at 200mg  every 10 days. He would like have estrogen levels checked in addition to testosterone.   He has not seen ortho for his shoulder as he has not been able to take off work for an appointment.  ROS:  A comprehensive ROS was completed and negative except as noted per HPI  Allergies  Allergen Reactions  . Morphine And Related Other (See Comments)    Stops heart  . Nsaids Other (See Comments)    Due to bariatric surgery    Past Medical History:  Diagnosis Date  . Hypertension     Past Surgical History:  Procedure Laterality Date  . HERNIA REPAIR      Social History   Socioeconomic History  . Marital status: Married    Spouse name: Not on file  . Number of children: Not on file  . Years of education: Not on file  . Highest education level: Not on file  Occupational History  . Not on file  Tobacco Use  . Smoking status: Never Smoker  . Smokeless tobacco: Never Used  Substance and Sexual Activity  . Alcohol use: Yes    Alcohol/week: 0.0 standard drinks  . Drug use: No  . Sexual activity: Yes    Partners: Female  Other Topics Concern  . Not on file  Social History Narrative  . Not on file   Social Determinants of Health   Financial Resource Strain: Not on file  Food Insecurity: Not on file  Transportation Needs: Not on file  Physical Activity: Not on file  Stress: Not on file  Social Connections: Not on file    Family History  Problem Relation Age of Onset  . Heart attack Other        grandfather   . Hyperlipidemia Mother     Health Maintenance  Topic Date Due  . Hepatitis C Screening  Never done  . HIV Screening  Never done  . TETANUS/TDAP   05/13/2024  . HPV VACCINES  Aged Out  . INFLUENZA VACCINE  Discontinued  . COVID-19 Vaccine  Discontinued     ----------------------------------------------------------------------------------------------------------------------------------------------------------------------------------------------------------------- Physical Exam BP (!) 145/83 (BP Location: Left Arm, Patient Position: Sitting, Cuff Size: Large)   Pulse 92   Temp 98.2 F (36.8 C)   Wt 257 lb (116.6 kg)   SpO2 97%   BMI 35.84 kg/m   Physical Exam Constitutional:      Appearance: Normal appearance.  Eyes:     General: No scleral icterus. Skin:    General: Skin is warm and dry.  Neurological:     General: No focal deficit present.     Mental Status: He is alert.  Psychiatric:        Mood and Affect: Mood normal.        Behavior: Behavior normal.     ------------------------------------------------------------------------------------------------------------------------------------------------------------------------------------------------------------------- Assessment and Plan  Hypogonadism in male He is doing well with current dosing of testosterone. Update testosterone and CBC today.  Will also check estradiol.    Right shoulder pain Labral tear and some tearing of subscapularis on MRI He is planning on scheduling with orthopedics.    No orders of the defined types were placed in this  encounter.   No follow-ups on file.    This visit occurred during the SARS-CoV-2 public health emergency.  Safety protocols were in place, including screening questions prior to the visit, additional usage of staff PPE, and extensive cleaning of exam room while observing appropriate contact time as indicated for disinfecting solutions.

## 2020-07-13 NOTE — Assessment & Plan Note (Signed)
Labral tear and some tearing of subscapularis on MRI He is planning on scheduling with orthopedics.

## 2020-07-13 NOTE — Assessment & Plan Note (Signed)
He is doing well with current dosing of testosterone. Update testosterone and CBC today.  Will also check estradiol.

## 2020-07-14 ENCOUNTER — Other Ambulatory Visit: Payer: Self-pay | Admitting: Family Medicine

## 2020-07-14 ENCOUNTER — Encounter: Payer: Self-pay | Admitting: Family Medicine

## 2020-07-14 LAB — CBC
HCT: 50 % (ref 38.5–50.0)
Hemoglobin: 16.9 g/dL (ref 13.2–17.1)
MCH: 29.2 pg (ref 27.0–33.0)
MCHC: 33.8 g/dL (ref 32.0–36.0)
MCV: 86.4 fL (ref 80.0–100.0)
MPV: 10.2 fL (ref 7.5–12.5)
Platelets: 338 10*3/uL (ref 140–400)
RBC: 5.79 10*6/uL (ref 4.20–5.80)
RDW: 12.9 % (ref 11.0–15.0)
WBC: 6.1 10*3/uL (ref 3.8–10.8)

## 2020-07-14 LAB — TESTOSTERONE: Testosterone: 1168 ng/dL — ABNORMAL HIGH (ref 250–827)

## 2020-07-14 LAB — ESTRADIOL: Estradiol: 64 pg/mL — ABNORMAL HIGH (ref <=39)

## 2020-07-14 MED ORDER — TESTOSTERONE CYPIONATE 200 MG/ML IM SOLN: INTRAMUSCULAR | 0 refills | Status: DC

## 2020-07-18 ENCOUNTER — Other Ambulatory Visit: Payer: Self-pay

## 2020-07-18 ENCOUNTER — Ambulatory Visit (INDEPENDENT_AMBULATORY_CARE_PROVIDER_SITE_OTHER): Payer: Medicaid Other | Admitting: Sports Medicine

## 2020-07-18 ENCOUNTER — Ambulatory Visit (INDEPENDENT_AMBULATORY_CARE_PROVIDER_SITE_OTHER): Payer: Medicaid Other

## 2020-07-18 DIAGNOSIS — M25511 Pain in right shoulder: Secondary | ICD-10-CM | POA: Diagnosis not present

## 2020-07-18 NOTE — Assessment & Plan Note (Signed)
Keijuan returns, he is a 35 year old male, he works as a Hospital doctor, he did have a labral tear and some subscapularis tearing on MRI, has not yet seen Dr. Everardo Pacific. We injected his subacromial bursa about 2 months ago, he is here requesting a repeat injection, we did one today but into the glenohumeral joint with ultrasound guidance, if this does not provide sufficient relief he will need to touch base with Dr. Everardo Pacific.

## 2020-07-18 NOTE — Progress Notes (Signed)
    Procedures performed today:    Procedure: Real-time Ultrasound Guided injection of the right glenohumeral Device: Samsung HS60  Verbal informed consent obtained.  Time-out conducted.  Noted no overlying erythema, induration, or other signs of local infection.  Skin prepped in a sterile fashion.  Local anesthesia: Topical Ethyl chloride.  With sterile technique and under real time ultrasound guidance:  Noted mild OA, 1 cc Kenalog 40, 2 cc lidocaine, 2 cc bupivacaine injected easily Completed without difficulty  Advised to call if fevers/chills, erythema, induration, drainage, or persistent bleeding.  Images permanently stored and available for review in PACS.  Impression: Technically successful ultrasound guided injection.  Independent interpretation of notes and tests performed by another provider:   None.  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 Everardo Pacific. We injected his subacromial bursa about 2 months ago, he is here requesting a repeat injection, we did one today but into the glenohumeral joint with ultrasound guidance, if this does not provide sufficient relief he will need to touch base with Dr. Everardo Pacific.    ___________________________________________ Ihor Austin. Benjamin Stain, M.D., ABFM., CAQSM. Primary Care and Sports Medicine [place] [facility]  Adjunct Instructor of Family Medicine  [employer]

## 2020-10-19 ENCOUNTER — Other Ambulatory Visit: Payer: Self-pay

## 2020-10-19 ENCOUNTER — Ambulatory Visit (INDEPENDENT_AMBULATORY_CARE_PROVIDER_SITE_OTHER): Payer: Medicaid Other

## 2020-10-19 ENCOUNTER — Ambulatory Visit (INDEPENDENT_AMBULATORY_CARE_PROVIDER_SITE_OTHER): Payer: BC Managed Care – PPO | Admitting: Sports Medicine

## 2020-10-19 DIAGNOSIS — G8929 Other chronic pain: Secondary | ICD-10-CM

## 2020-10-19 DIAGNOSIS — M25511 Pain in right shoulder: Secondary | ICD-10-CM | POA: Diagnosis not present

## 2020-10-19 MED ORDER — TRAMADOL HCL 50 MG PO TABS: 50.0000 mg | ORAL_TABLET | Freq: Three times a day (TID) | ORAL | 0 refills | Status: DC | PRN

## 2020-10-19 NOTE — Progress Notes (Signed)
    Procedures performed today:    Procedure: Real-time Ultrasound Guided injection of the right glenohumeral joint Device: Samsung HS60  Verbal informed consent obtained.  Time-out conducted.  Noted no overlying erythema, induration, or other signs of local infection.  Skin prepped in a sterile fashion.  Local anesthesia: Topical Ethyl chloride.  With sterile technique and under real time ultrasound guidance: Normal-appearing joint, 1 cc Kenalog 40, 2 cc lidocaine, 2 cc bupivacaine injected easily Completed without difficulty  Advised to call if fevers/chills, erythema, induration, drainage, or persistent bleeding.  Images permanently stored and available for review in PACS.  Impression: Technically successful ultrasound guided injection.  Independent interpretation of notes and tests performed by another provider:   None.  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 He understands he will need arthroscopic repair of his labrum but he cannot do this from an appointment standpoint until January which means we will probably need to do another couple of injections to keep him going. Celecoxib was ineffective so we will switch to tramadol for pain. Return to see me as needed.  This is a chronic process with exacerbation and pharmacologic intervention    ___________________________________________ Ihor Austin. Benjamin Stain, M.D., ABFM., CAQSM. Primary Care and Sports Medicine [place] [facility]  Adjunct Instructor of Family Medicine  [employer]

## 2020-10-19 NOTE — Assessment & Plan Note (Signed)
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 He understands he will need arthroscopic repair of his labrum but he cannot do this from an appointment standpoint until January which means we will probably need to do another couple of injections to keep him going. Celecoxib was ineffective so we will switch to tramadol for pain. Return to see me as needed.  This is a chronic process with exacerbation and pharmacologic intervention

## 2020-10-25 ENCOUNTER — Telehealth: Payer: Self-pay | Admitting: Family Medicine

## 2020-10-25 ENCOUNTER — Ambulatory Visit: Payer: Medicaid Other | Admitting: Family Medicine

## 2020-10-25 NOTE — Telephone Encounter (Signed)
Patient called at 850 to cancel appointment for this afternoon. I took him off the schedule. He rescheduled for 10/31/2020.

## 2020-10-31 ENCOUNTER — Ambulatory Visit: Payer: Medicaid Other | Admitting: Family Medicine

## 2020-11-06 ENCOUNTER — Ambulatory Visit: Payer: Medicaid Other | Admitting: Family Medicine

## 2020-11-07 ENCOUNTER — Ambulatory Visit: Payer: Medicaid Other | Admitting: Family Medicine

## 2020-11-07 ENCOUNTER — Other Ambulatory Visit: Payer: Self-pay

## 2020-11-07 ENCOUNTER — Other Ambulatory Visit: Payer: Self-pay | Admitting: Family Medicine

## 2020-11-07 ENCOUNTER — Telehealth: Payer: Self-pay | Admitting: Family Medicine

## 2020-11-07 DIAGNOSIS — E291 Testicular hypofunction: Secondary | ICD-10-CM

## 2020-11-07 MED ORDER — TESTOSTERONE CYPIONATE 200 MG/ML IM SOLN: INTRAMUSCULAR | 0 refills | Status: DC

## 2020-11-07 NOTE — Telephone Encounter (Signed)
Pt called. He states that he had an appointment scheduled for this morning but could not make it and sent a message over via mychart at 7:05 to cancel  Thank you

## 2020-11-09 ENCOUNTER — Other Ambulatory Visit: Payer: Self-pay

## 2020-11-09 DIAGNOSIS — M25511 Pain in right shoulder: Secondary | ICD-10-CM

## 2020-11-10 MED ORDER — TRAMADOL HCL 50 MG PO TABS: 50.0000 mg | ORAL_TABLET | Freq: Three times a day (TID) | ORAL | 0 refills | Status: DC | PRN

## 2020-11-15 ENCOUNTER — Ambulatory Visit: Payer: Medicaid Other | Admitting: Family Medicine

## 2020-11-22 ENCOUNTER — Ambulatory Visit (INDEPENDENT_AMBULATORY_CARE_PROVIDER_SITE_OTHER): Payer: BC Managed Care – PPO | Admitting: Family Medicine

## 2020-11-22 ENCOUNTER — Other Ambulatory Visit: Payer: Self-pay

## 2020-11-22 ENCOUNTER — Encounter: Payer: Self-pay | Admitting: Family Medicine

## 2020-11-22 VITALS — BP 138/86 | HR 80 | Temp 97.5°F | Ht 71.0 in | Wt 249.1 lb

## 2020-11-22 DIAGNOSIS — E291 Testicular hypofunction: Secondary | ICD-10-CM

## 2020-11-22 DIAGNOSIS — E348 Other specified endocrine disorders: Secondary | ICD-10-CM

## 2020-11-22 NOTE — Assessment & Plan Note (Signed)
Update testosterone as well as estradiol levels today.

## 2020-11-22 NOTE — Progress Notes (Signed)
Spencer Delgado - 35 y.o. male MRN [id number]  Date of birth: [birth year]  Subjective Chief Complaint  Patient presents with   testosterone    HPI Spencer Delgado is a 35 year old male here today for follow-up of hypogonadism.  He is currently using testosterone cypionate 150 mg every 10 days.  He has no issues with injections.  He states that he felt better taking the knee 200 mg dose every 10 days previously however testosterone readings were significantly elevated with elevated estrogen levels as well.  ROS:  A comprehensive ROS was completed and negative except as noted per HPI  Allergies  Allergen Reactions   Morphine And Related Other (See Comments)    Stops heart   Nsaids Other (See Comments)    Due to bariatric surgery    Past Medical History:  Diagnosis Date   Hypertension     Past Surgical History:  Procedure Laterality Date   HERNIA REPAIR      Social History   Socioeconomic History   Marital status: Married    Spouse name: Not on file   Number of children: Not on file   Years of education: Not on file   Highest education level: Not on file  Occupational History   Not on file  Tobacco Use   Smoking status: Never   Smokeless tobacco: Never  Substance and Sexual Activity   Alcohol use: Yes    Alcohol/week: 0.0 standard drinks   Drug use: No   Sexual activity: Yes    Partners: Female  Other Topics Concern   Not on file  Social History Narrative   Not on file   Social Determinants of Health   Financial Resource Strain: Not on file  Food Insecurity: Not on file  Transportation Needs: Not on file  Physical Activity: Not on file  Stress: Not on file  Social Connections: Not on file    Family History  Problem Relation Age of Onset   Heart attack Other        grandfather    Hyperlipidemia Mother     Health Maintenance  Topic Date Due   HIV Screening  Never done   Hepatitis C Screening  Never done   TETANUS/TDAP  05/13/2024   Pneumococcal Vaccine  101-53 Years old  Aged Out   HPV VACCINES  Aged Out   INFLUENZA VACCINE  Discontinued   COVID-19 Vaccine  Discontinued     ----------------------------------------------------------------------------------------------------------------------------------------------------------------------------------------------------------------- Physical Exam BP (!) 145/90 (BP Location: Left Arm, Patient Position: Sitting, Cuff Size: Large)   Pulse 80   Temp (!) 97.5 F (36.4 C)   Ht 5\' 11"  (1.803 m)   Wt 249 lb 1.6 oz (113 kg)   SpO2 97%   BMI 34.74 kg/m   Physical Exam Constitutional:      Appearance: Normal appearance.  HENT:     Head: Normocephalic.  Cardiovascular:     Rate and Rhythm: Normal rate and regular rhythm.  Pulmonary:     Effort: Pulmonary effort is normal.     Breath sounds: Normal breath sounds.  Musculoskeletal:     Cervical back: Neck supple.  Neurological:     Mental Status: He is alert.  Psychiatric:        Mood and Affect: Mood normal.        Behavior: Behavior normal.    ------------------------------------------------------------------------------------------------------------------------------------------------------------------------------------------------------------------- Assessment and Plan  Hypogonadism in male Update testosterone as well as estradiol levels today.     No orders of the defined types were placed in  this encounter.   No follow-ups on file.    This visit occurred during the SARS-CoV-2 public health emergency.  Safety protocols were in place, including screening questions prior to the visit, additional usage of staff PPE, and extensive cleaning of exam room while observing appropriate contact time as indicated for disinfecting solutions.

## 2020-11-23 LAB — CBC WITH DIFFERENTIAL/PLATELET
Absolute Monocytes: 417 {cells}/uL (ref 200–950)
Basophils Absolute: 9 {cells}/uL (ref 0–200)
Basophils Relative: 0.2 %
Eosinophils Absolute: 90 {cells}/uL (ref 15–500)
Eosinophils Relative: 2.1 %
HCT: 48.9 % (ref 38.5–50.0)
Hemoglobin: 16.5 g/dL (ref 13.2–17.1)
Lymphs Abs: 1114 {cells}/uL (ref 850–3900)
MCH: 29.3 pg (ref 27.0–33.0)
MCHC: 33.7 g/dL (ref 32.0–36.0)
MCV: 86.9 fL (ref 80.0–100.0)
MPV: 9.8 fL (ref 7.5–12.5)
Monocytes Relative: 9.7 %
Neutro Abs: 2670 {cells}/uL (ref 1500–7800)
Neutrophils Relative %: 62.1 %
Platelets: 310 Thousand/uL (ref 140–400)
RBC: 5.63 Million/uL (ref 4.20–5.80)
RDW: 13.3 % (ref 11.0–15.0)
Total Lymphocyte: 25.9 %
WBC: 4.3 Thousand/uL (ref 3.8–10.8)

## 2020-11-23 LAB — ESTRADIOL: Estradiol: 26 pg/mL (ref <=39)

## 2020-11-23 LAB — TESTOSTERONE: Testosterone: 436 ng/dL (ref 250–827)

## 2020-11-25 ENCOUNTER — Encounter: Payer: Self-pay | Admitting: Family Medicine

## 2021-01-19 ENCOUNTER — Ambulatory Visit (INDEPENDENT_AMBULATORY_CARE_PROVIDER_SITE_OTHER): Payer: BC Managed Care – PPO | Admitting: Sports Medicine

## 2021-01-19 ENCOUNTER — Ambulatory Visit (INDEPENDENT_AMBULATORY_CARE_PROVIDER_SITE_OTHER): Payer: BC Managed Care – PPO

## 2021-01-19 DIAGNOSIS — G8929 Other chronic pain: Secondary | ICD-10-CM | POA: Diagnosis not present

## 2021-01-19 DIAGNOSIS — M25511 Pain in right shoulder: Secondary | ICD-10-CM

## 2021-01-19 NOTE — Assessment & Plan Note (Signed)
Spencer Delgado returns, he is a pleasant 35 year old male, works as a Hospital doctor, chronic right shoulder pain, MRI did show subscapularis and labral tearing, did well with glenohumeral injection about 3 months ago, subacromial injections were ineffective. We have emphasized that he needs arthroscopic repair over the many months but tells me he could not do this until January, he is agreeable to touch base with the surgeon again, he gave him another glenohumeral injection today, but he understands that I really do not have anything else to offer him. NSAIDs and tramadol ineffective. Return as needed.

## 2021-01-19 NOTE — Progress Notes (Signed)
    Procedures performed today:    Procedure: Real-time Ultrasound Guided injection of the right glenohumeral joint Device: Samsung HS60  Verbal informed consent obtained.  Time-out conducted.  Noted no overlying erythema, induration, or other signs of local infection.  Skin prepped in a sterile fashion.  Local anesthesia: Topical Ethyl chloride.  With sterile technique and under real time ultrasound guidance: Noted mild synovitis, 1 cc Kenalog 40, 2 cc lidocaine, 2 cc bupivacaine injected easily Completed without difficulty  Advised to call if fevers/chills, erythema, induration, drainage, or persistent bleeding.  Images permanently stored and available for review in PACS.  Impression: Technically successful ultrasound guided injection.  Independent interpretation of notes and tests performed by another provider:   None.  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 We have emphasized that he needs arthroscopic repair over the many months but tells me he could not do this until January, he is agreeable to touch base with the surgeon again, he gave him another glenohumeral injection today, but he understands that I really do not have anything else to offer him. NSAIDs and tramadol ineffective. Return as needed.    ___________________________________________ Spencer Delgado. Benjamin Stain, M.D., ABFM., CAQSM. Primary Care and Sports Medicine [facility]  Adjunct Instructor of Family Medicine  [employer]

## 2021-01-30 DIAGNOSIS — M25511 Pain in right shoulder: Secondary | ICD-10-CM | POA: Diagnosis not present

## 2021-02-09 ENCOUNTER — Encounter: Payer: Self-pay | Admitting: Family Medicine

## 2021-02-13 NOTE — Telephone Encounter (Signed)
Does he have any blood pressure readings from home?

## 2021-02-14 ENCOUNTER — Ambulatory Visit: Payer: BC Managed Care – PPO | Admitting: Family Medicine

## 2021-02-14 ENCOUNTER — Telehealth: Payer: Self-pay | Admitting: Family Medicine

## 2021-02-14 NOTE — Telephone Encounter (Signed)
Pt called at 2:30. He stated that he works nights and slept through his appointment. Thanks.

## 2021-02-22 ENCOUNTER — Ambulatory Visit: Payer: BC Managed Care – PPO | Admitting: Family Medicine

## 2021-02-27 ENCOUNTER — Ambulatory Visit: Payer: BC Managed Care – PPO | Admitting: Family Medicine

## 2021-04-02 ENCOUNTER — Ambulatory Visit: Payer: BC Managed Care – PPO | Admitting: Family Medicine

## 2021-04-04 ENCOUNTER — Encounter: Payer: Self-pay | Admitting: Family Medicine

## 2021-04-04 ENCOUNTER — Ambulatory Visit: Payer: BC Managed Care – PPO | Admitting: Family Medicine

## 2021-04-09 ENCOUNTER — Other Ambulatory Visit: Payer: Self-pay

## 2021-04-09 DIAGNOSIS — E291 Testicular hypofunction: Secondary | ICD-10-CM

## 2021-04-10 MED ORDER — TESTOSTERONE CYPIONATE 200 MG/ML IM SOLN: INTRAMUSCULAR | 1 refills | Status: AC

## 2021-04-10 NOTE — Progress Notes (Signed)
Rx sent 

## 2021-04-13 ENCOUNTER — Other Ambulatory Visit: Payer: Self-pay

## 2021-04-13 ENCOUNTER — Ambulatory Visit (INDEPENDENT_AMBULATORY_CARE_PROVIDER_SITE_OTHER): Payer: Medicaid Other | Admitting: Physician Assistant

## 2021-04-13 ENCOUNTER — Encounter: Payer: Self-pay | Admitting: Physician Assistant

## 2021-04-13 VITALS — BP 146/87 | HR 74 | Temp 98.1°F | Ht 71.0 in | Wt 263.0 lb

## 2021-04-13 DIAGNOSIS — I10 Essential (primary) hypertension: Secondary | ICD-10-CM

## 2021-04-13 MED ORDER — LISINOPRIL 10 MG PO TABS: 10.0000 mg | ORAL_TABLET | Freq: Every day | ORAL | 2 refills | Status: DC

## 2021-04-13 NOTE — Patient Instructions (Signed)

## 2021-04-13 NOTE — Progress Notes (Signed)
   Subjective:    Patient ID: Spencer Delgado, male    DOB: [birth year], 35 y.o.   MRN: [id number]  HPI Pt is a 35 yo male with hx of HTN who presents to the clinic for BP. 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 He has gained 18lbs in last few months due to rotator cuff injury. This weight has caused BP to increase. Home readings 140/90. No CP, palpitations, headaches, or vision changes. Pt does not drink alcohol or smoke. Family hx of HTN.   .. 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  Objective:   Physical Exam Vitals reviewed.  Constitutional:      Appearance: Normal appearance.  HENT:     Head: Normocephalic.  Cardiovascular:     Rate and Rhythm: Normal rate and regular rhythm.     Pulses: Normal pulses.     Heart sounds: Normal heart sounds.  Pulmonary:     Effort: Pulmonary effort is normal.     Breath sounds: Normal breath sounds.  Neurological:     General: No focal deficit present.     Mental Status: He is alert and oriented to person, place, and time.  Psychiatric:        Mood and Affect: Mood normal.   .. Depression screen [facility] 2/9 11/22/2020 07/21/2017  Decreased Interest 0 0  Down, Depressed, Hopeless 0 0  PHQ - 2 Score  0 0         Assessment & Plan:  Marland KitchenMarland KitchenDonel was seen today for hypertension and weight gain.  Diagnoses and all orders for this visit:  Essential hypertension -     lisinopril (ZESTRIL) 10 MG tablet; Take 1 tablet (10 mg total) by mouth daily.  Discussed low salt diet and regular exercise.  When he is able to work out and lose weight he thinks he can go back off medication. Added lisinopril 10mg  daily. Tolerated lisinopril in the past.  Follow up in 1 month with PCP.

## 2021-04-26 ENCOUNTER — Encounter: Payer: Self-pay | Admitting: Physician Assistant

## 2021-04-26 MED ORDER — LISINOPRIL 20 MG PO TABS: 20.0000 mg | ORAL_TABLET | Freq: Every day | ORAL | 0 refills | Status: AC

## 2021-05-08 ENCOUNTER — Ambulatory Visit: Payer: Medicaid Other | Admitting: Family Medicine

## 2021-06-07 ENCOUNTER — Ambulatory Visit (HOSPITAL_BASED_OUTPATIENT_CLINIC_OR_DEPARTMENT_OTHER): Admit: 2021-06-07 | Payer: Medicaid Other | Admitting: Orthopaedic Surgery

## 2021-06-07 ENCOUNTER — Encounter (HOSPITAL_BASED_OUTPATIENT_CLINIC_OR_DEPARTMENT_OTHER): Payer: Self-pay

## 2021-06-07 SURGERY — SHOULDER ARTHROSCOPY WITH SUBACROMIAL DECOMPRESSION AND BICEP TENDON REPAIR
Anesthesia: Choice | Laterality: Right

## 2021-09-20 ENCOUNTER — Emergency Department (INDEPENDENT_AMBULATORY_CARE_PROVIDER_SITE_OTHER): Payer: Medicaid Other

## 2021-09-20 ENCOUNTER — Emergency Department (INDEPENDENT_AMBULATORY_CARE_PROVIDER_SITE_OTHER)
Admission: EM | Admit: 2021-09-20 | Discharge: 2021-09-20 | Disposition: A | Discharge status: Home / Self Care | Payer: Self-pay | Source: Home / Self Care

## 2021-09-20 DIAGNOSIS — S134XXA Sprain of ligaments of cervical spine, initial encounter: Secondary | ICD-10-CM

## 2021-09-20 DIAGNOSIS — R079 Chest pain, unspecified: Secondary | ICD-10-CM

## 2021-09-20 DIAGNOSIS — R0781 Pleurodynia: Secondary | ICD-10-CM

## 2021-09-20 DIAGNOSIS — M542 Cervicalgia: Secondary | ICD-10-CM

## 2021-09-20 MED ORDER — BACLOFEN 10 MG PO TABS: 10.0000 mg | ORAL_TABLET | Freq: Three times a day (TID) | ORAL | 0 refills | Status: AC
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
## 2021-09-20 NOTE — Discharge Instructions (Addendum)
Advised/informed patient of chest x-ray results and neck x-ray results with hard copies provided to patient.  Advised patient may take Baclofen daily or as needed for accompanying muscle spasms of neck.  Patient increase daily water intake while taking this medication.  Advised patient if symptoms worsen and/or unresolved please follow-up with PCP or here for further evaluation. ?
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
## 2022-02-27 ENCOUNTER — Ambulatory Visit: Payer: Medicaid Other | Admitting: Sports Medicine

## 2022-03-04 ENCOUNTER — Ambulatory Visit: Payer: Medicaid Other | Admitting: Sports Medicine

## 2023-11-25 ENCOUNTER — Ambulatory Visit: Payer: Self-pay

## 2023-11-25 NOTE — Telephone Encounter (Signed)
  FYI Only or Action Required?: FYI only for provider.  Patient was last seen in primary care on 04/13/2021 by Antoniette Vermell CROME, PA-C.  Called Nurse Triage reporting Nausea.  Symptoms began about a month ago.  Interventions attempted: Nothing.  Symptoms are: unchanged.  Triage Disposition: See Physician Within 24 Hours  Patient/caregiver understands and will follow disposition?:  Copied from CRM [id number]. Topic: Clinical - Red Word Triage >> Nov 25, 2023 12:36 PM Mercer PEDLAR wrote: Red Word that prompted transfer to Nurse Triage: Nausea and yellowing in eyes. Reason for Disposition  Yellowish color of the skin or white of the eye (i.e., jaundice)  Answer Assessment - Initial Assessment Questions 1. NAUSEA SEVERITY: How bad is the nausea? (e.g., mild, moderate, severe; dehydration, weight loss)     mild 2. ONSET: When did the nausea begin?     Two months ago 3. VOMITING: Any vomiting? If Yes, ask: How many times today?     denies 4. RECURRENT SYMPTOM: Have you had nausea before? If Yes, ask: When was the last time? What happened that time?     denies 5. CAUSE: What do you think is causing the nausea?     Denies  Also reports yellowing of eyes about two weeks ago  Protocols used: Nausea-A-AH

## 2023-11-25 NOTE — Telephone Encounter (Signed)
 It looks like he goes to [facility]. Why is he seeing me?

## 2023-11-25 NOTE — Telephone Encounter (Signed)
 Spencer Delgado is checking into this.

## 2023-11-26 NOTE — Telephone Encounter (Signed)
 Per Gladystine - patient wanted to stay with primary care of [facility] so could not be seen in our office as we are not the patient's PCP. Patient was recommended to go to urgent care of ER if could not be seen at his PCP.

## 2023-11-27 ENCOUNTER — Ambulatory Visit: Payer: Self-pay | Admitting: Family Medicine

## 2024-01-13 ENCOUNTER — Encounter: Payer: Self-pay | Admitting: Sports Medicine

## 2024-05-03 ENCOUNTER — Ambulatory Visit: Payer: Self-pay

## 2024-05-03 NOTE — Telephone Encounter (Signed)
 FYI Only or Action Required?: FYI only for provider: pt does not want to establish care will go to UC or ED.  Called Nurse Triage reporting Testicle Pain.  Symptoms began about a month ago.  Interventions attempted: Nothing.  Symptoms are: gradually worsening.  Triage Disposition: See Physician Within 24 Hours  Patient/caregiver understands and will follow disposition?: Yes, will follow disposition  Copied from CRM #[id number]. Topic: Clinical - Red Word Triage >> May 03, 2024 10:24 AM Mercer PEDLAR wrote: Red Word that prompted transfer to Nurse Triage: pain in right testicle burning, itchiness. Reason for Disposition  [1] Pain comes and goes (intermittent) AND [2] present > 24 hours  Answer Assessment - Initial Assessment Questions 1. LOCATION and RADIATION: Where is the pain located?      R side of penis shaft and testicle, occurs with ejaculation and now when working out or doing heavy lifting 2. QUALITY: What does the pain feel like?  (e.g., sharp, dull, aching, burning)     Sharp when working out, during ejaculation-sharp but then proceeds to dull ache 3. SEVERITY: How bad is the pain?  (Scale 1-10; or mild, moderate, severe)   - MILD (1-3): doesn't interfere with normal activities    - MODERATE (4-7): interferes with normal activities (e.g., work or school) or awakens from sleep   - SEVERE (8-10): excruciating pain, unable to do any normal activities, difficulty walking     When it occurs 7 4. ONSET: When did the pain start?     About a month 5. PATTERN: Does it come and go, or has it been constant since it started?     intermittent 6. SCROTAL APPEARANCE: What does the scrotum look like? Is there any swelling or redness?      Redness to scrotum 7. HERNIA: Has a doctor ever told you that you have a hernia?     Pt had hernia repair on that side years ago 8. OTHER SYMPTOMS: Do you have any other symptoms? (e.g., fever, abdominal pain, vomiting, difficulty passing  urine)     Constipation and weight gain  Denies STI.  Protocols used: Scrotal Pain-A-AH
